# Patient Record
Sex: Male | Born: 1960 | Race: White | Hispanic: No | Marital: Married | Smoking: Never smoker
Health system: Western US, Academic
[De-identification: ages and names within clinical notes are randomized; demographics above are authoritative.]

---

## 2015-12-22 IMAGING — DX CHEST PA AND LATERAL
3 series · 3 of 3 positions shown · non-contrast
Comparison: none

CLINICAL INDICATION:  Cough. Chest pain for one week. Evaluate for pneumonia.
No
other chest complaints.
COMPARISON EXAMINATIONS: None prior.

[lateral]
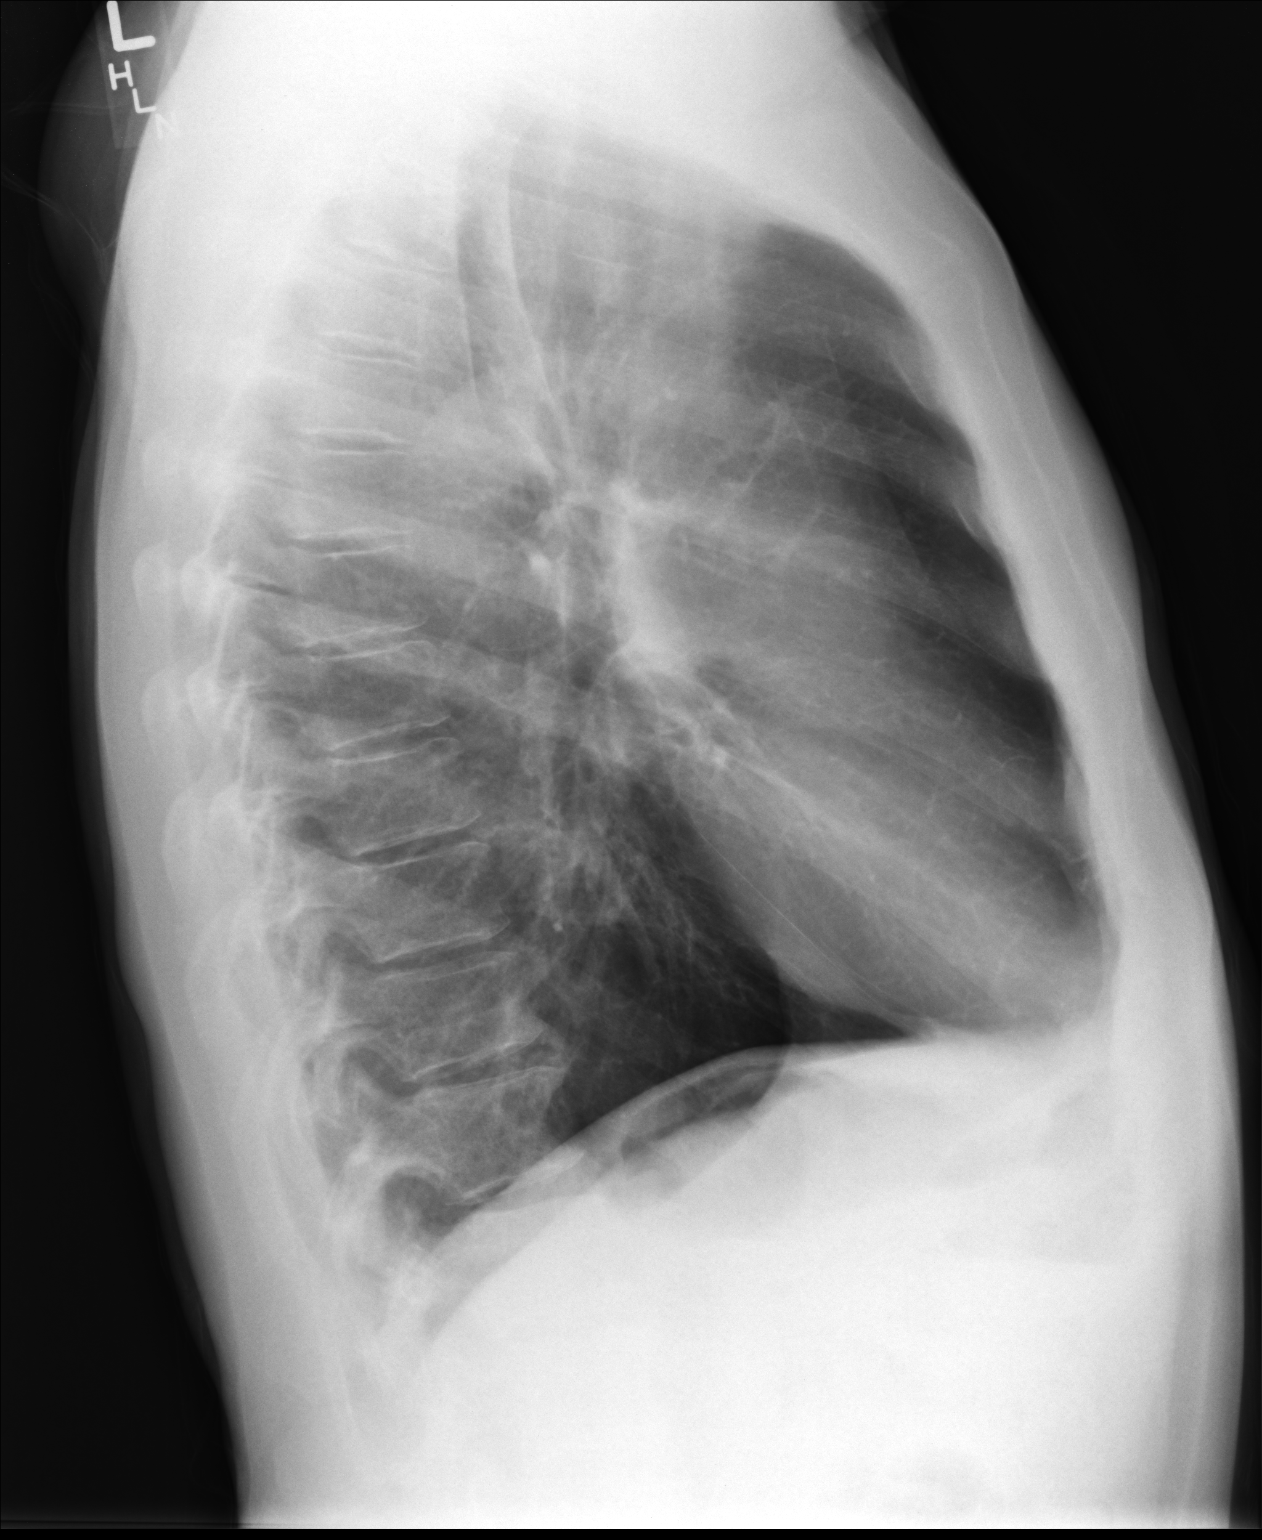

[PA (1 of 2)]
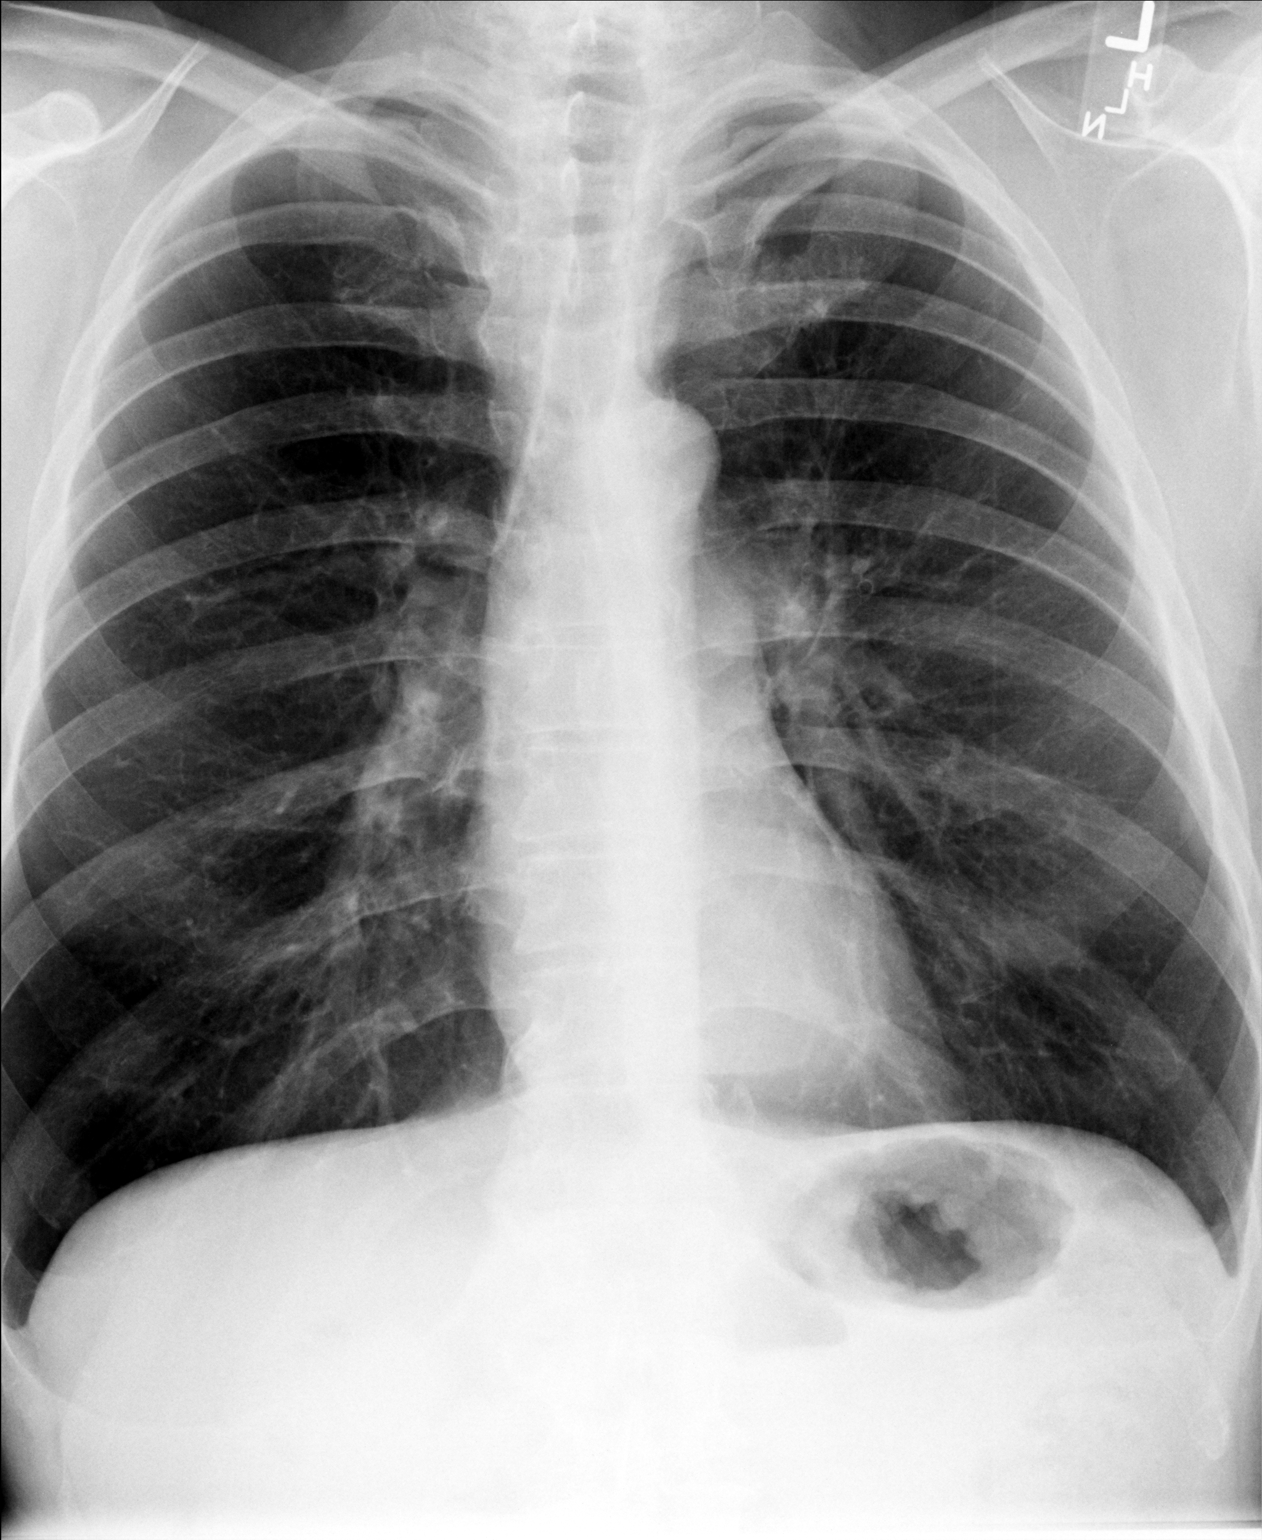

[PA (2 of 2)]
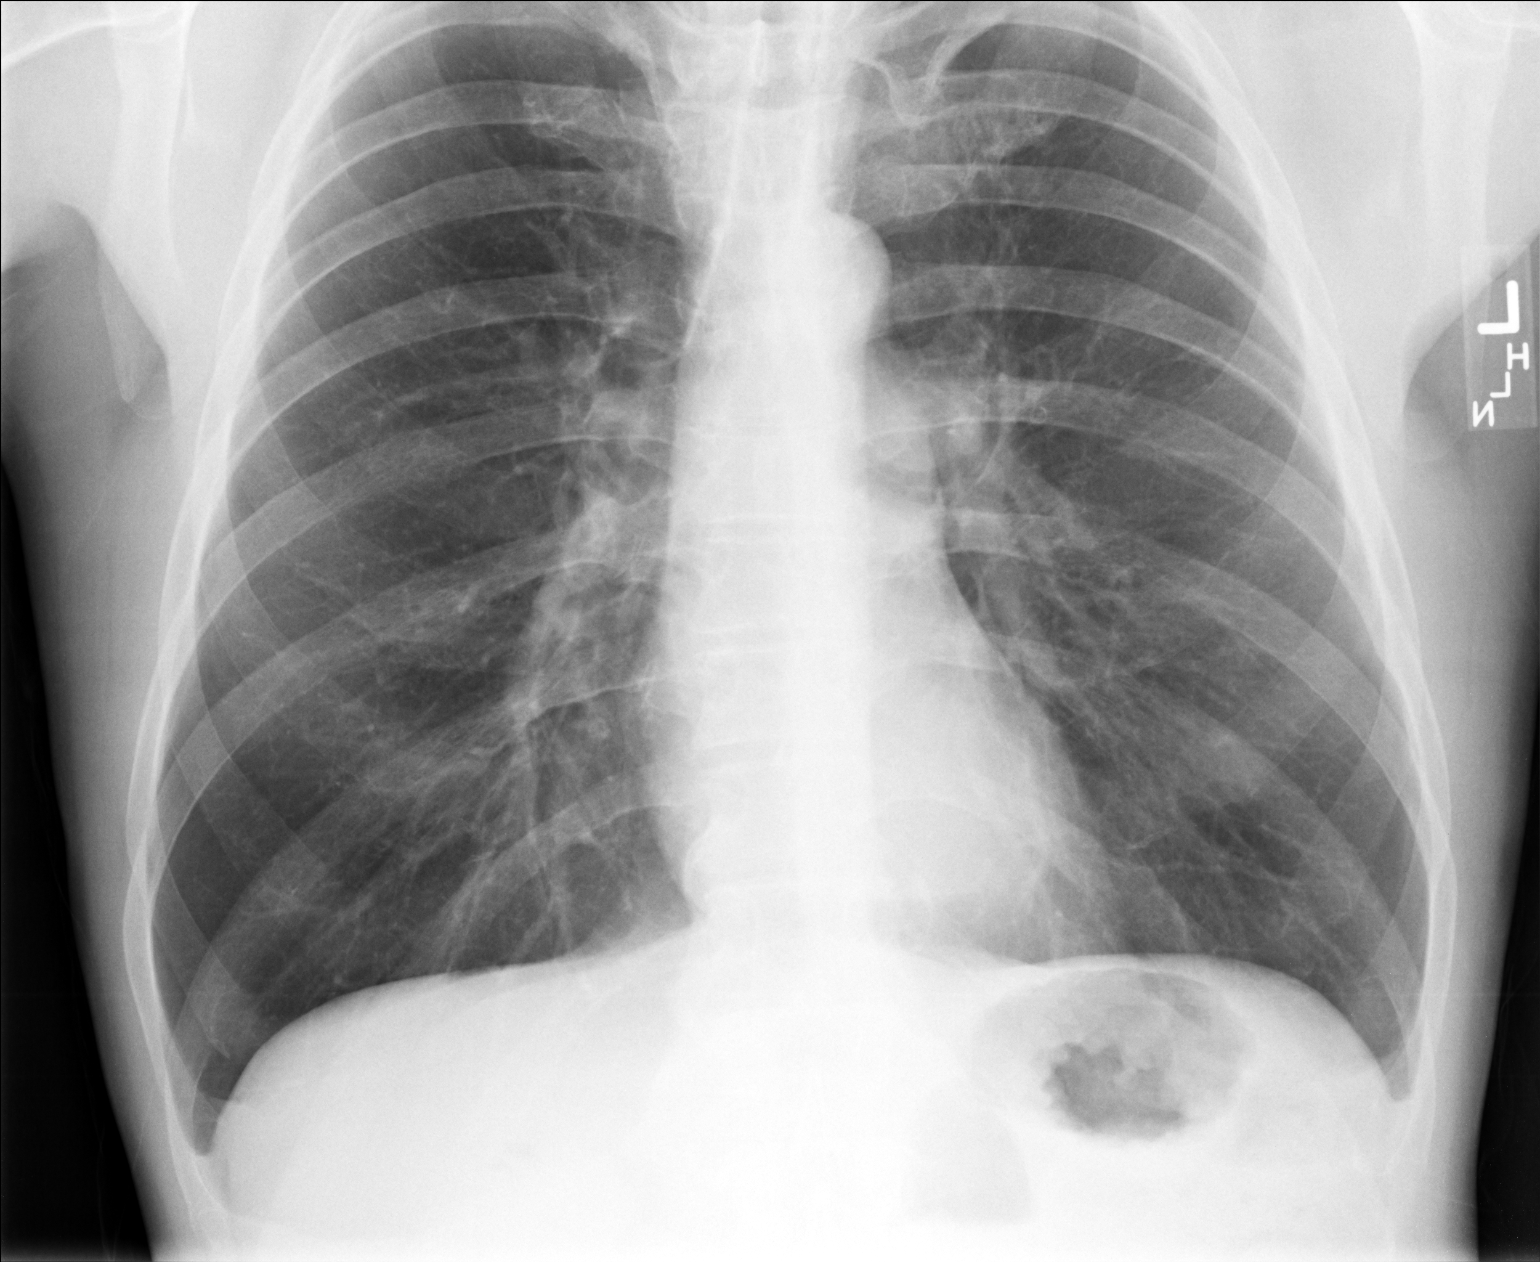

[3 of 3 positions shown; findings below may reference images not displayed]

FINDINGS: The lungs are clear. No focal consolidation. No effusion. No
pneumothorax. Normal cardiomediastinal silhouette. No acute osseous
abnormality.
IMPRESSION: No acute cardiopulmonary findings.

## 2020-01-29 IMAGING — MR MRI CERVICAL SPINE WITHOUT CONTRAST
7 series · 40 of 48 positions shown · IV contrast (gadolinium)
Comparison: None

Arizola, Eddyson 
MRI CERVICAL SPINE WITHOUT CONTRAST, 01/29/2020 [DATE]: 
CLINICAL INDICATION: PICC the pubic cervical pain extending the arms and 
shoulders
TECHNIQUE: Sagittal T1, Sagittal T2, Sagittal STIR, Axial TSE and Axial SA44L 
images of the cervical spine were performed without intravenous gadolinium 
enhancement.

[Series 101: survey · axial · 10.0mm · 0.94mm/px · z∈[-15,+150]mm · 3 of 9 slices shown]
[im 1/9]
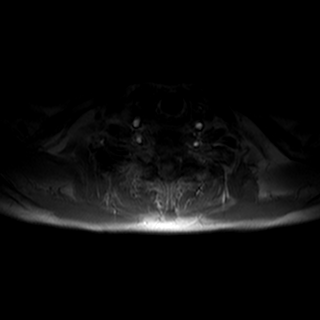
[im 5/9]
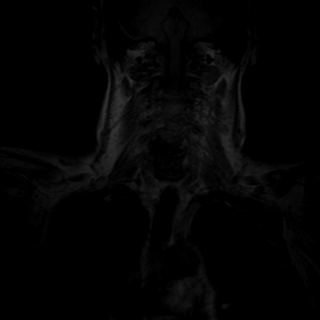
[im 9/9]
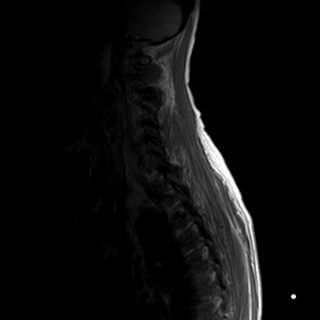

[Series 301: t1w_tse sag · sagittal · 3.0mm · 0.45mm/px · 5 of 17 slices shown]
[im 1/17]
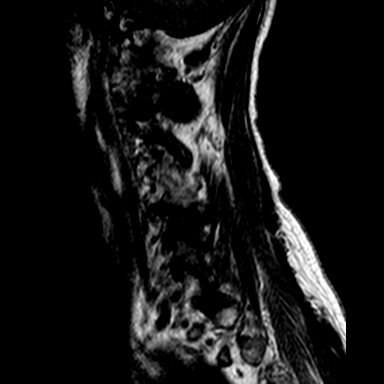
[im 5/17]
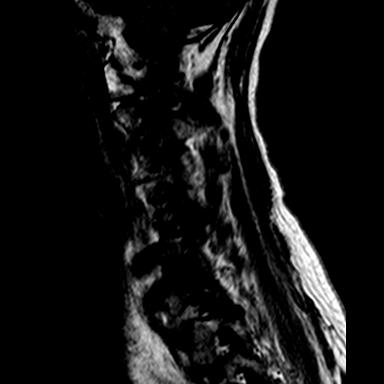
[im 9/17]
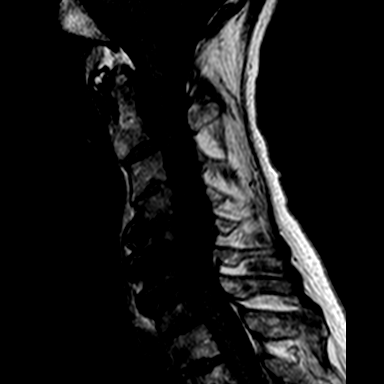
[im 13/17]
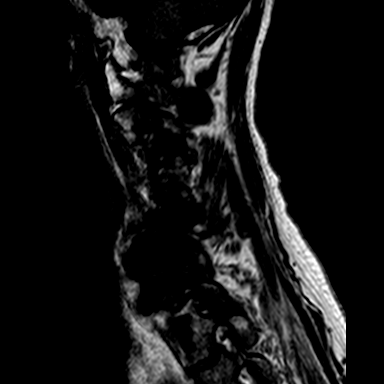
[im 17/17]
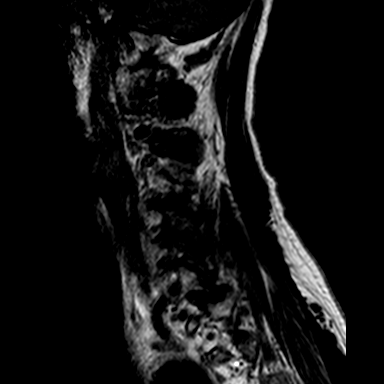

[Series 401: t2w_tse sag · sagittal · 3.0mm · 0.46mm/px · 5 of 17 slices shown]
[im 1/17]
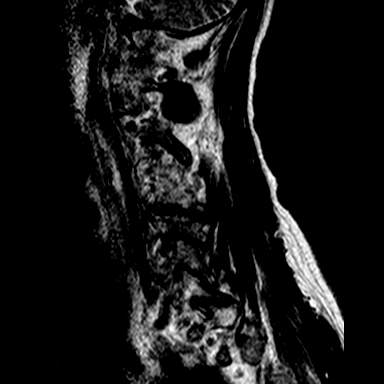
[im 5/17]
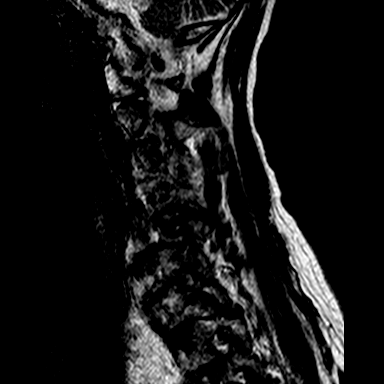
[im 9/17]
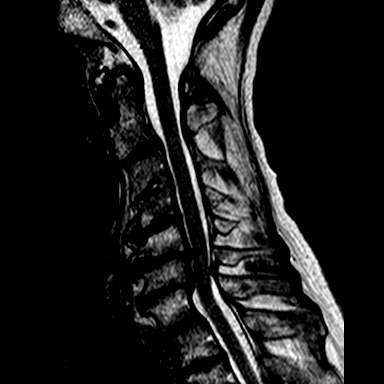
[im 13/17]
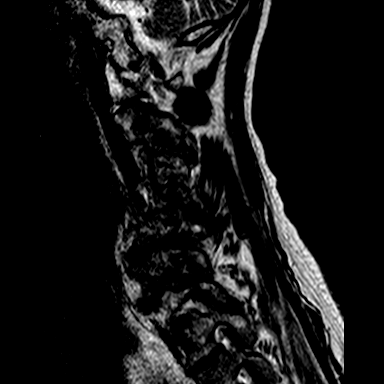
[im 17/17]
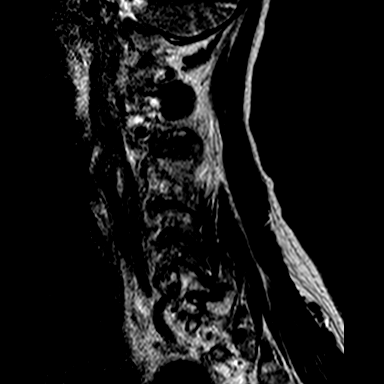

[Series 601: t2w_tse ax · axial · 3.0mm · 0.44mm/px · z∈[-45,+56]mm · 8 of 36 slices shown]
[im 1/36]
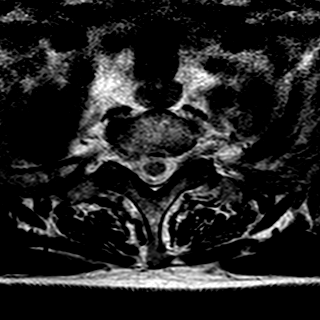
[im 4/36]
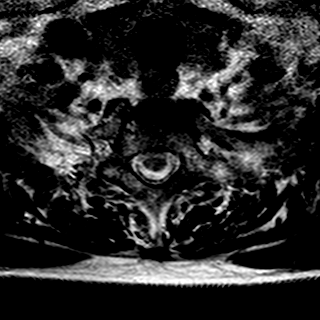
[im 12/36]
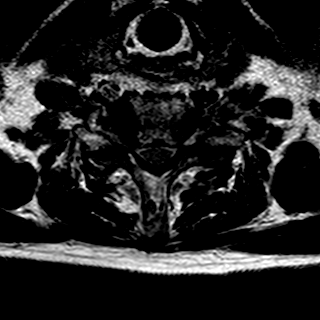
[im 16/36]
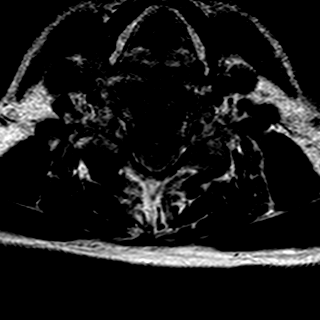
[im 20/36]
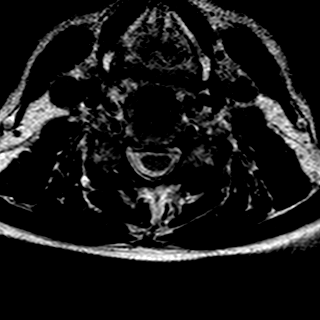
[im 24/36]
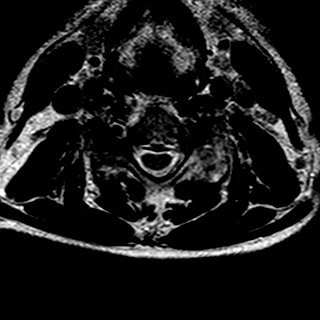
[im 32/36]
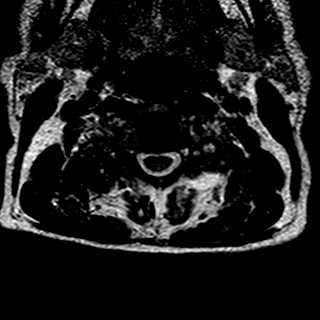
[im 36/36]
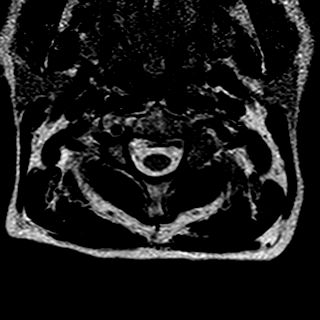

[Series 701: t2w_ffe_(id) · axial · 3.0mm · 0.44mm/px · z∈[+3,+53]mm · 8 of 36 slices shown (1 of 2)]
[im 1/36]
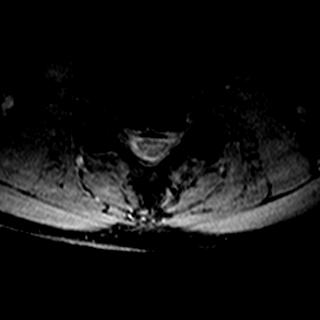
[im 4/36]
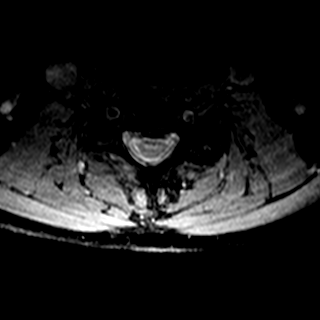
[im 12/36]
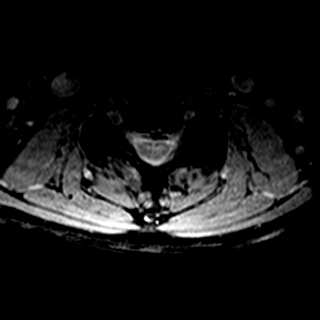
[im 16/36]
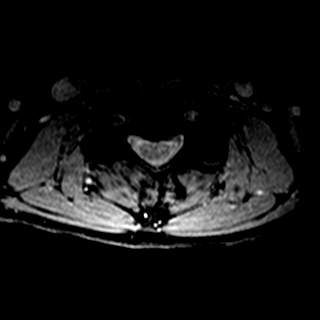
[im 20/36]
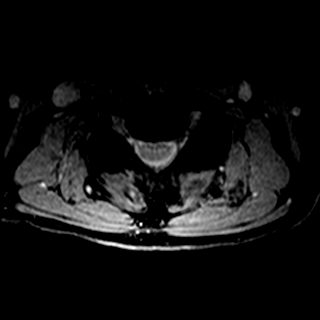
[im 24/36]
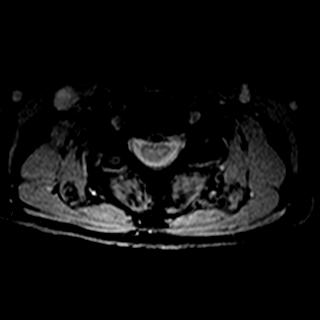
[im 32/36]
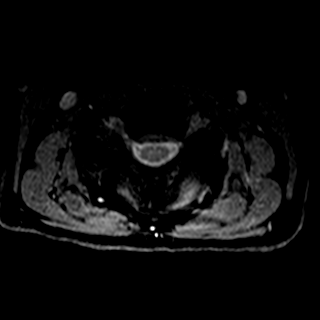
[im 36/36]
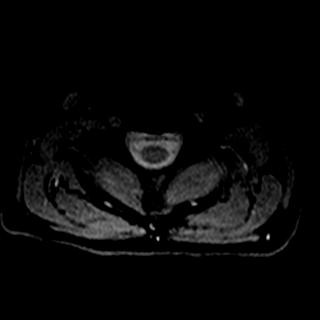

[Series 801: t2w_ffe_(id) · axial · 3.0mm · 0.44mm/px · z∈[-49,+1]mm · 8 of 36 slices shown (2 of 2)]
[im 1/36]
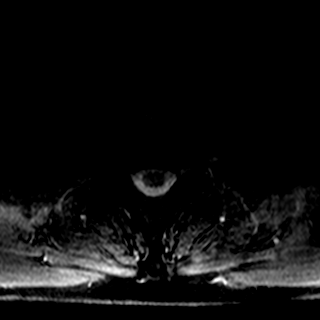
[im 4/36]
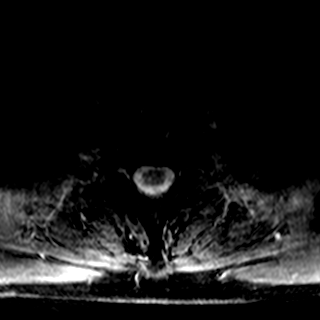
[im 12/36]
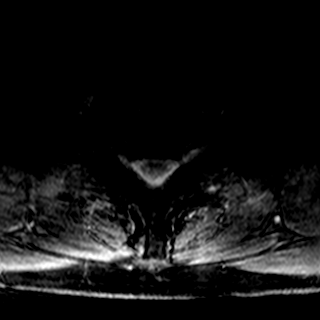
[im 16/36]
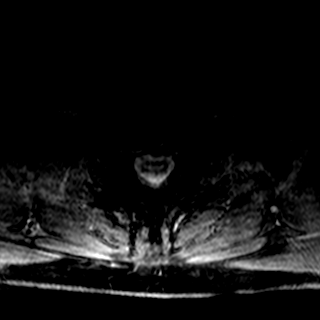
[im 20/36]
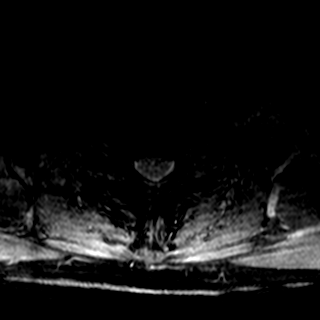
[im 24/36]
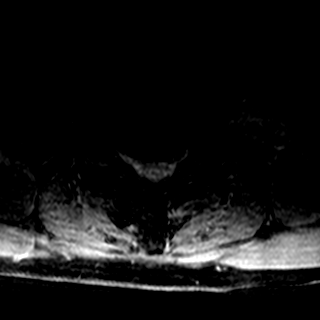
[im 32/36]
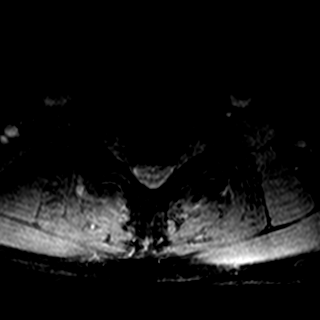
[im 36/36]
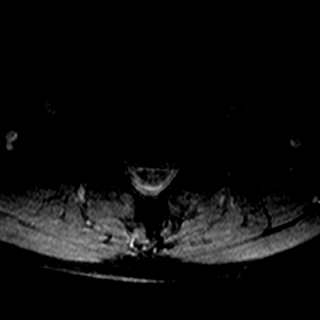

[Series 901: stir_longte sag · sagittal · 3.0mm · 0.55mm/px · 3 of 17 slices shown]
[im 1/17]
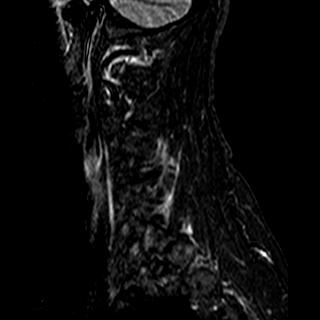
[im 5/17]
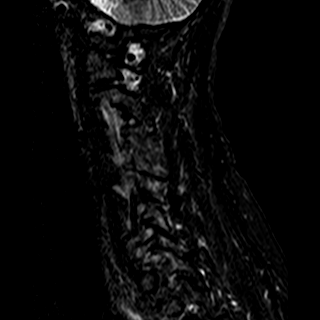
[im 9/17]
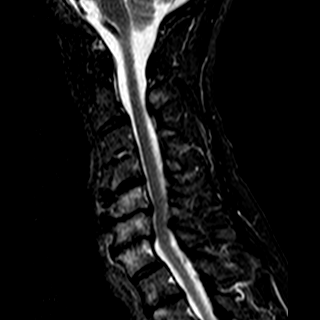

[40 of 48 positions shown; findings below may reference images not displayed]

FINDINGS: Cervical vertebral heights are there is spondylosis, with marked disc 
narrowing at C3-4, C5-6 and C6-7, also at T1-2. There is less than grade 1 
anterolisthesis at C7-T1 measuring 2 mm. Lordosis is straightened. There is 
slight anterolisthesis at C2-3, approximately 2 mm. 
There are atlantoaxial degenerative changes. 
Sagittal STIR images show reactive endplate edema, most pronounced at C5-6 and 
C6-7. Cord signal is normal. The craniocervical junction is open. The dens and 
atlanto-dental interval are intact. Lower posterior fossa contents are 
unremarkable. There is no evidence for recent fracture or spinal malignancy. 
There are zygapophyseal facet degenerative changes. 
Axial images from C2-C3 through C7-T1 show mild disc bulge at C2-3 slightly 
separated from the cord, with mild canal stenosis. There is moderate to marked 
left, moderate right foraminal stenosis. 
At C3-4 the canal is borderline narrow. There appears to be moderate to marked 
bilateral foraminal stenosis. 
At C4-5 the canal is borderline narrow. There is marked bilateral foraminal 
stenosis, axial image 22. 
At C5-6 there is broad-based disc bulge mildly deforming the ventral cord, with 
canal diameter 7.5 mm. There is marked right, moderate left foraminal stenosis, 
axial image 17. 
At C6-7 disc-osteophyte slightly effaces the ventral cord, with canal diameter 8 
mm. There is marked right, moderate to marked left foraminal stenosis. 
At C7-T1 the canal and foramina are open. 
At T1-2 the canal and foramina are open.
IMPRESSION: Spondylosis. Canal stenosis is most pronounced at C5-6 where there is mild cord 
deformity. At C6-7 there is slight cord deformity . There is foraminal stenosis 
throughout the cervical spine as described. 
Straightened lordosis could indicate spasm. 
There are degenerative reactive endplate changes, most pronounced at C5-6 and 
C6-7. There is no evidence for fracture or malignancy. No cord signal 
abnormality identified. 
There are advanced zygapophyseal facet changes throughout the cervical spine.

## 2020-10-01 IMAGING — DX KNEE 2 VIEWS RIGHT
1 series · 2 of 2 positions shown · non-contrast
Comparison: None.

KNEE 2 VIEWS RIGHT, KNEE 2 VIEWS LEFT, 10/01/2020 [DATE]: 
CLINICAL INDICATION:  Left knee pain. Right knee for comparison.

[Series 1: AP · 0.14mm/px · 2 of 2 slices shown]
[im 1/2]
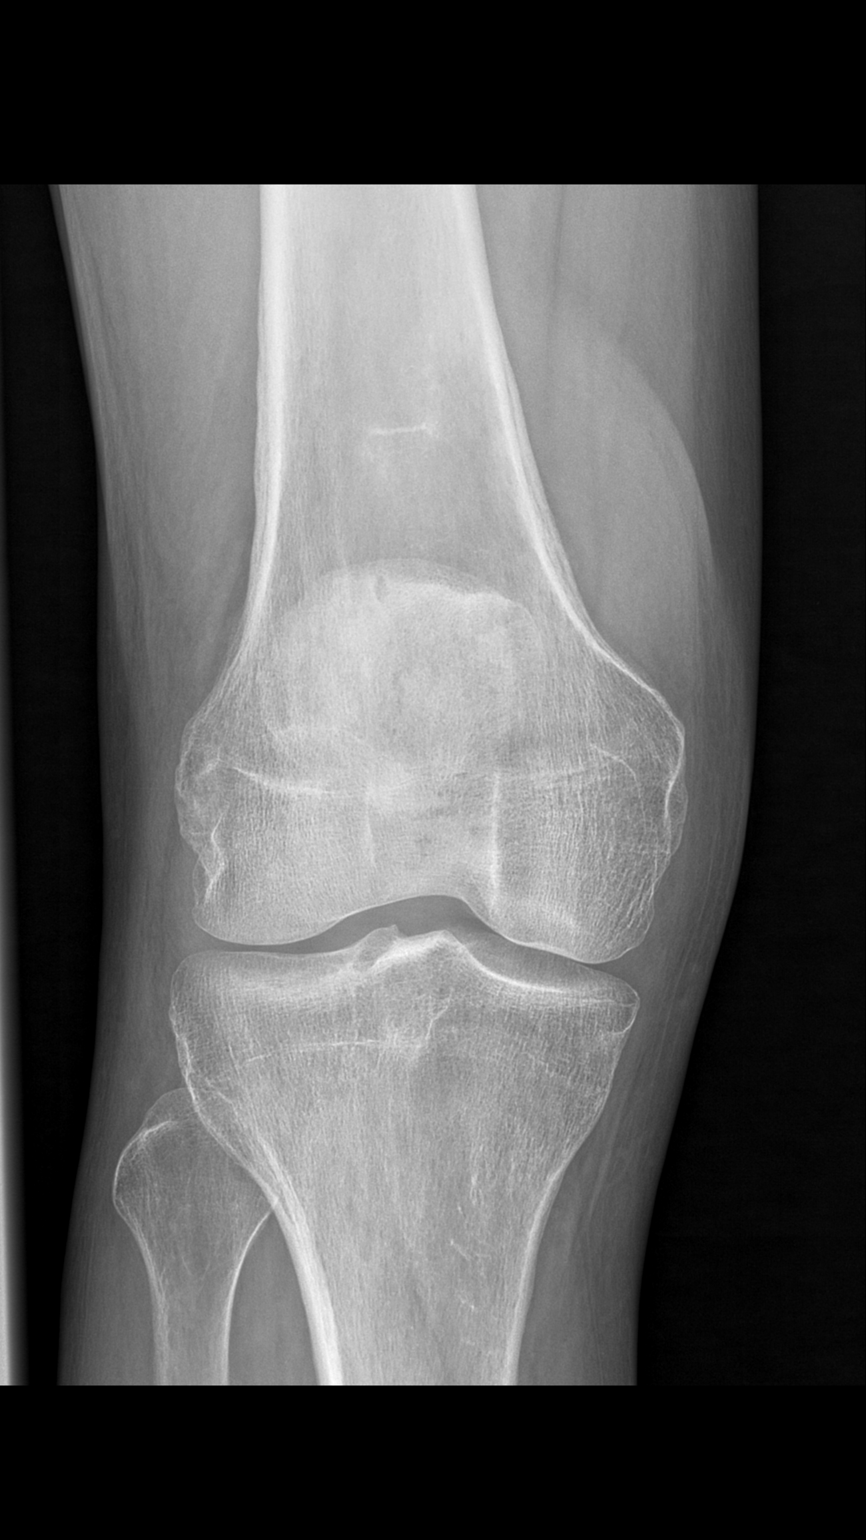
[im 2/2]
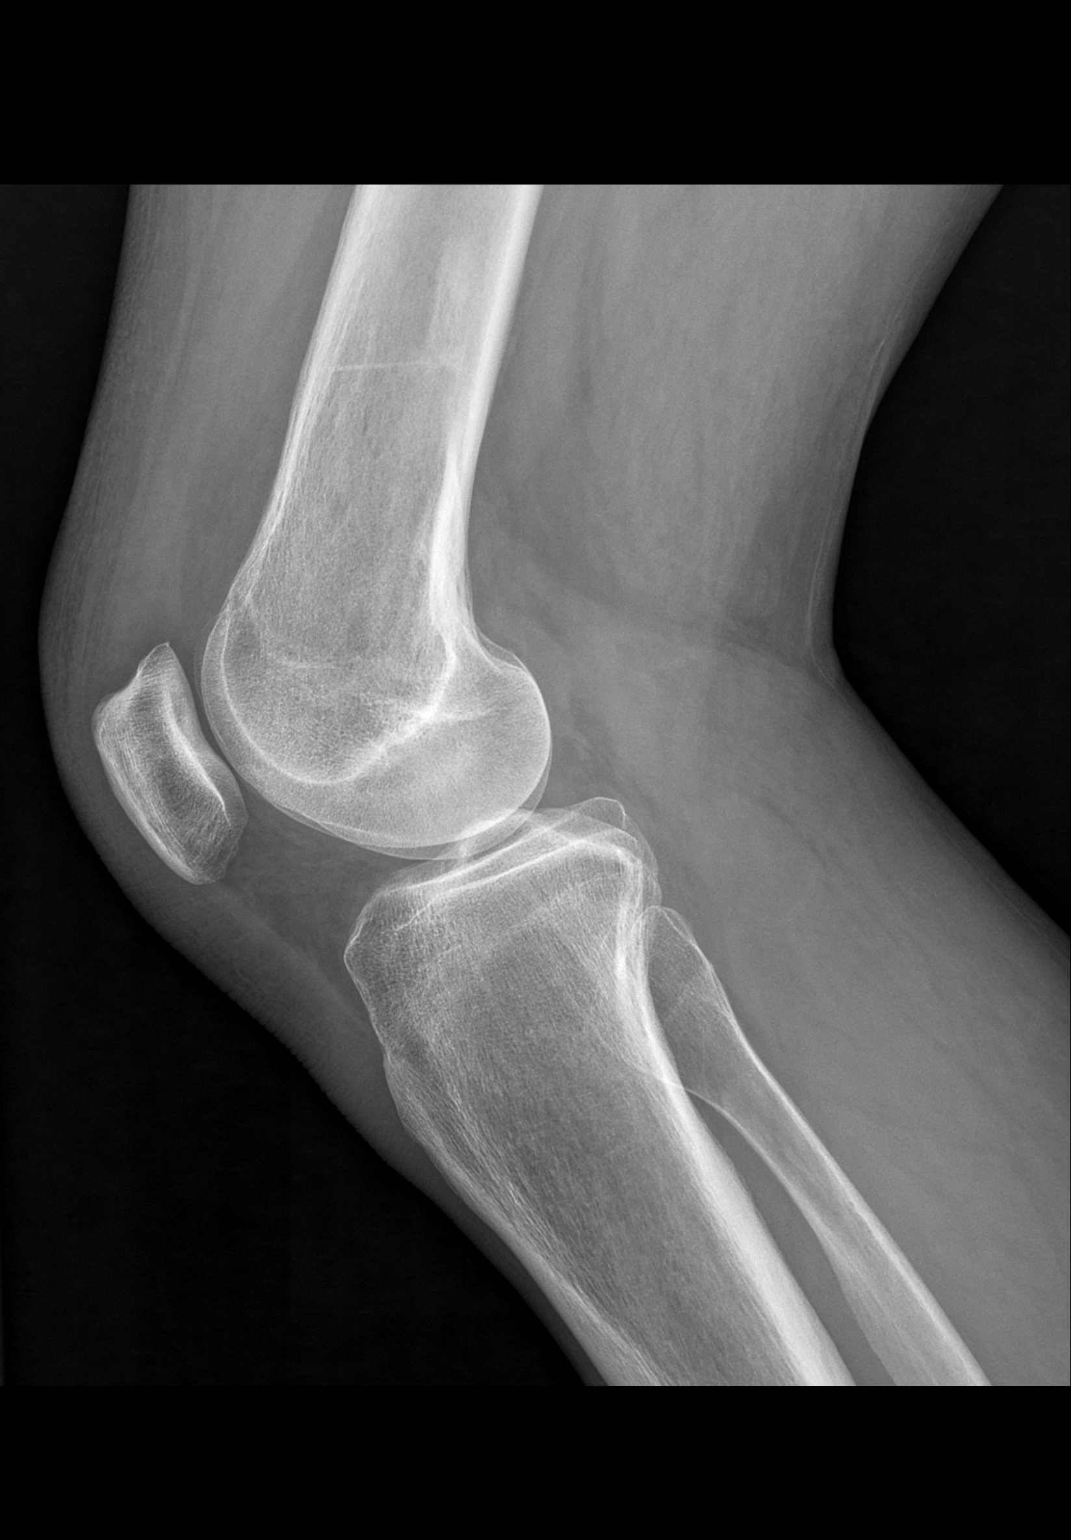

[2 of 2 positions shown; findings below may reference images not displayed]

FINDINGS: No fracture. Moderate left medial and mild patellofemoral joint 
degenerative change and small joint effusion. Right knee joint spaces are 
preserved. Normal bone mineralization. Mild atherosclerosis.
IMPRESSION: 1. Moderate degenerative change of the left knee and small joint effusion. 
2. Normal right knee.

## 2020-10-01 IMAGING — DX KNEE 2 VIEWS LEFT
1 series · 2 of 2 positions shown · non-contrast
Comparison: None.

KNEE 2 VIEWS RIGHT, KNEE 2 VIEWS LEFT, 10/01/2020 [DATE]: 
CLINICAL INDICATION:  Left knee pain. Right knee for comparison.

[Series 1: AP · 0.14mm/px · 2 of 2 slices shown]
[im 1/2]
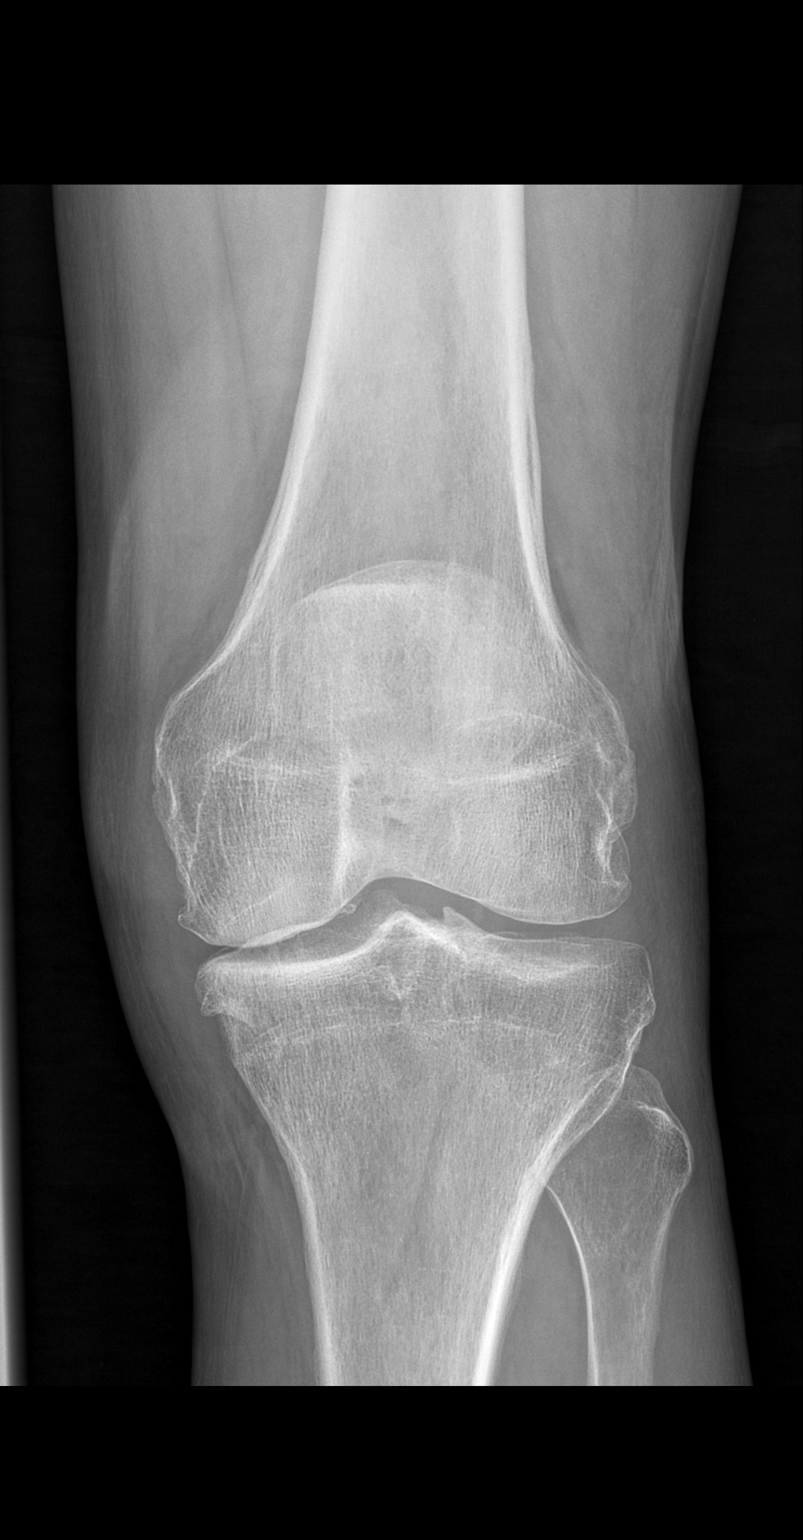
[im 2/2]
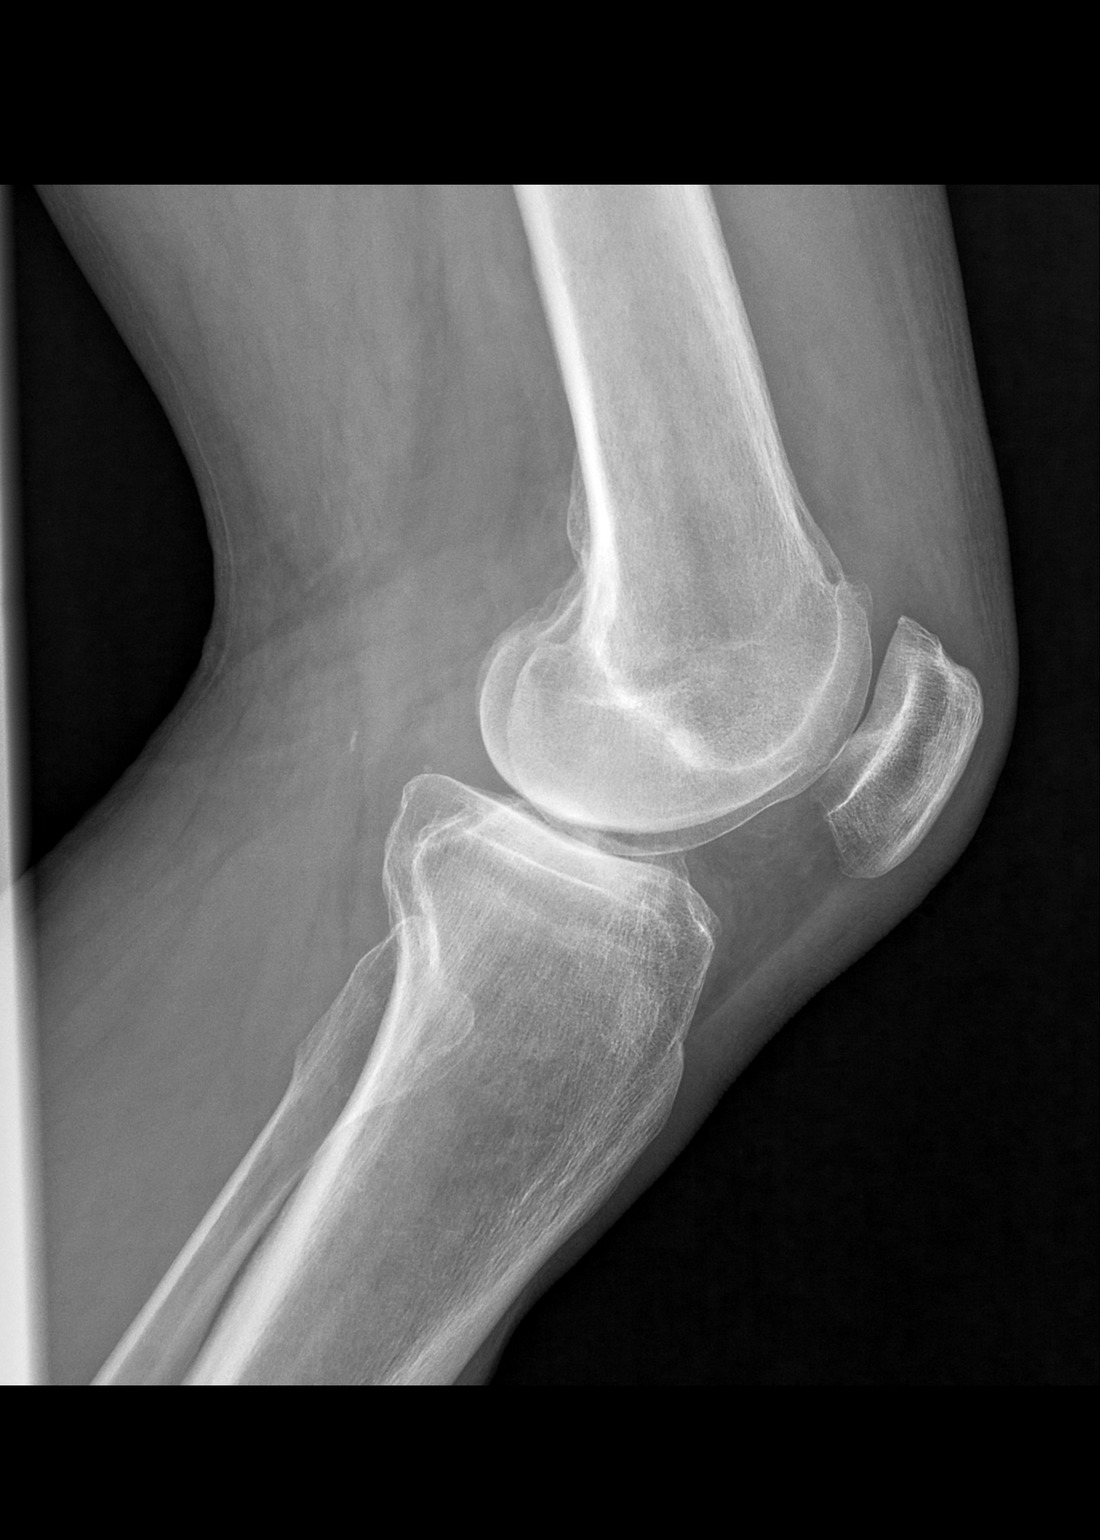

[2 of 2 positions shown; findings below may reference images not displayed]

FINDINGS: No fracture. Moderate left medial and mild patellofemoral joint 
degenerative change and small joint effusion. Right knee joint spaces are 
preserved. Normal bone mineralization. Mild atherosclerosis.
IMPRESSION: 1. Moderate degenerative change of the left knee and small joint effusion. 
2. Normal right knee.

## 2021-01-19 ENCOUNTER — Encounter: Payer: Self-pay | Admitting: Orthopaedic Surgery

## 2021-01-19 DIAGNOSIS — M25561 Pain in right knee: Secondary | ICD-10-CM

## 2021-01-20 ENCOUNTER — Inpatient Hospital Stay (INDEPENDENT_AMBULATORY_CARE_PROVIDER_SITE_OTHER)
Admit: 2021-01-20 | Discharge: 2021-01-20 | Disposition: A | Discharge status: Home Health | Payer: 59 | Attending: Orthopaedic Surgery | Admitting: Orthopaedic Surgery

## 2021-01-20 ENCOUNTER — Ambulatory Visit (INDEPENDENT_AMBULATORY_CARE_PROVIDER_SITE_OTHER): Payer: 59 | Admitting: Orthopaedic Surgery

## 2021-01-20 ENCOUNTER — Encounter: Payer: Self-pay | Admitting: Orthopaedic Surgery

## 2021-01-20 VITALS — BP 107/84 | HR 92 | Ht 71.0 in | Wt 175.0 lb

## 2021-01-20 DIAGNOSIS — M170 Bilateral primary osteoarthritis of knee: Secondary | ICD-10-CM

## 2021-01-20 DIAGNOSIS — M25561 Pain in right knee: Secondary | ICD-10-CM

## 2021-01-20 DIAGNOSIS — M25461 Effusion, right knee: Secondary | ICD-10-CM

## 2021-01-20 NOTE — Patient Instructions (Addendum)
Osteoarthritis     You were diagnosed with osteoarthritis.    Osteoarthritis (OA) is a type of problem with the joints where they start to suffer from wear and tear, as you get older. It is also called "degenerative arthritis" or "degenerative joint disease."    Normally, there is a tough, rubbery layer of cartilage on the ends of each of the bones that makes up a joint. The cartilage is there to protect the bone and to provide a cushioned, slippery surface. This way, the bones can move smoothly when the joint is bending. When the cartilage gets worn, the bone ends are not as well protected. As the exposed bones rub against each other when the joint bends, it causes irritation, pain and swelling. Any joint can be affected. However, OA is most common in the spine, hands, hips, knees, and feet.     OA is more common as a person gets older. Aging itself does not cause OA. However, changes in the body that happen when one gets older make the chance of getting OA more likely.     Other things that can speed up the development of OA are:     Repetitive activity that involves constantly stressing the joints.   Being overweight. This puts more wear and tear on the joints.   Family history of OA. OA tends to run in families.   Fractures that extend into the joint and cause injury to the cartilage surfaces.     Symptoms of OA can include:   Joint pain. This is usually made worse with heavy activity and repetitive bending.   Stiffness in the joint when bending and moving the joint. This is usually worse in the morning. It gets better with activity as the joint "warms up."   Swelling. Joint swelling can happen when fluid seeps into the joint from chronic irritation.   Crackling and popping noises when the joint is moved.   Lumps of enlarged bone that can develop on the fingers.    There is no cure for OA. The treatment of OA is designed to control pain and slow down the wear and tear process. Some things that  are used to treat OA include:     Pain medications: Acetaminophen (Tylenol) and medications called NSAIDS like ibuprofen (Advil/Motrin) are often used first for pain control.     Steroid injections: This can cause temporary relief of pain. However, repeated injections into the joints can lead to more joint problems.     Life style changes: This includes weight loss and getting involved in exercise programs to help strengthen muscles around painful joints without making the wear and tear worse.     Physical therapy (PT): This may help to build strength and decrease joint stiffness.     In more severe cases, joint replacement surgery may be an option if symptoms can't be controlled using conventional measures.    There does NOT seem to be any evidence that "alternative treatments" like vitamins (A, E, and C), glucosamine, ginger, chondroitin sulfate and others have any helpful effects. ALWAYS talk to your doctor if you are going to try any alternative treatments.     Take all prescribed medications as directed. Unless you are told something different by your doctors, you can take over-the-counter pain medicines. These medicines can include ibuprofen (Advil/Motrin) or acetaminophen (Tylenol). Follow the directions on the package.    Get involved in any physical therapy recommended by your doctor.    YOU SHOULD   SEEK MEDICAL ATTENTION IMMEDIATELY, EITHER HERE OR AT THE NEAREST EMERGENCY DEPARTMENT, IF ANY OF THE FOLLOWING OCCUR:   You have any of the above symptoms and are unable to see or contact your doctor.   You get a fever (temperature higher than 100.43F or 38C) that is associated with pain or redness over your affected joint.   You have severe swelling in your painful joint.    If you can't follow up with your doctor, or if at any time you feel you need to be rechecked or seen again, come back here or go to the nearest emergency department.      For more information including non-operative  treatment, please go to:     [email or web address]

## 2021-01-20 NOTE — Progress Notes (Signed)
Cory Nguyen. Yoskar Murrillo, MD   Chief, Division of Hip & Knee Arthroplasty   Assistant Clinical Professor     Department of Orthopaedic Surgery     [facility]   [address] [place], [place] [address]  [phone]     [facility]   [address]   [place], [place] [address]  [phone]                         Office Consultation    Date of Visit: 01/20/2021    Chief Complaint: right knee pain.    History of Present Illness:  Cory Nguyen is a pleasant 60 year old White Non-Hispanic male, who presents to clinic for evaluation.    He has been kindly referred by Dr. Seymour Bars. Symptoms have been present for 8 years. They started gradually. There was no injury. Quality described as pain. He reports occasional painful knee popping. Symptoms occur daily. Patient reports prior lower extremity injury/pain: s/p bilateral knee arthroscopic meniscectomy. Symptoms are getting worse. Pain is 4-6/10 on average. He has had x-rays. He has tried physical therapy with significant relief. He will take approximately 2 ibuprofen per month. He has had a series of three Synvisc injections to each knee with improvement in his left knee pain, no improvement in his right knee pain. He feels his gait is abnormal due to his knee pain and is worried this will eventually cause more problems.     The patient denies any nausea, vomiting, fever, chills, chest pain, shortness of breath, or any other complications.    PAST MEDICAL HISTORY:   depression    PAST SURGICAL HISTORY:  Hernia repair - 1997  Bilateral knee meniscectomy (right in 2014)    FAMILY HISTORY:  Patient denies pertinent family history.    SOCIAL HISTORY:  Social History     Socioeconomic History   . Marital status: Married     Spouse name: Not on file   . Number of children: Not on file   . Years of education: Not on file   . Highest education level: Not on file   Occupational History   . Not on file   Tobacco Use   . Smoking status: Never Smoker   . Smokeless  tobacco: Never Used   Substance and Sexual Activity   . Alcohol use: Not on file   . Drug use: Not on file   . Sexual activity: Not on file   Other Topics Concern   . Not on file   Social History Narrative   . Not on file     Social Determinants of Health     Financial Resource Strain: Not on file   Food Insecurity: Not on file   Transportation Needs: Not on file   Physical Activity: Not on file   Stress: Not on file   Social Connections: Not on file   Intimate Partner Violence: Not on file   Housing Stability: Not on file       MEDICATIONS:   No outpatient medications have been marked as taking for the 01/20/21 encounter (Office Visit) with Roosevelt Eimers, Cory Bales, MD.         ALLERGIES:   No Known Allergies    REVIEW OF SYSTEMS:    Positive per HPI as well as glasses/contacts, depression and sleep disturbance, otherwise negative for 12-point review or systems.    Physical Examination  General: The patient  is awake, alert, and oriented to person, place, time and situation. The patient is in no acute distress.  Psych: The patient is in good mental health, well groomed and well nourished for age.    Vital Signs:  BP 107/84 (BP Location: Left arm, BP Patient Position: Sitting, BP cuff size: Regular)   Pulse 92   Ht 5\' 11"  (1.803 m)   Wt 79.4 kg (175 lb)   BMI 24.41 kg/m   HEENT: EOMI, trachea midline.  Cardiovascular: +S1S2, peripheral edema not present  Pulmonary: normal respiratory rate, non-labored breathing, no dyspnea  Skin: Intact.  No open lesions, lacerations, or wounds  Lymphatic System: There is no lymphedema, pitting edema, or calf tenderness.  Neurological: Sensation is intact to light touch from L2-S1 distribution bilaterally.  Motor function is 5/5 in all muscle groups including the quads, hamstrings, EHL, FHL, FDL, EDL, TA, and gastrocsoleus complex.  Vascular: Less then 2 second capillary refill, +2 pulses with good distal perfusion.  Musculoskeletal:  The patient is using nothing to assist with ambulation. He  does have an antalgic limp.     Examination of right knee demonstrates 2-150 degrees ROM without pain throughout. Stable to varus and valgus stress throughout range of motion. Mild varus deformity fully correctable. positive patellar grind test. positive medial joint line tenderness to palpation. Negative lateral joint line tenderness to palpation.     Examination of left knee demonstrates 2-160 degrees ROM without pain throughout. Positive medial joint line tenderness to palpation. Negative lateral joint line tenderness to palpation. Minimal patellar grind.     REVIEW OF IMAGING:   I personally reviewed and interpreted the plain films obtained in clinic today which demonstrates moderate to severe right knee osteoarthritis with near complete medial joint space narrowing, subchondral sclerosis, osteophyte formation. Near complete patellofemoral joint space narrowing, subchondral sclerosis, osteophyte formation.      ASSESSMENT AND PLAN:    This is a pleasant, 60 year old White Non-Hispanic male, who is being seen in consultation for bilateral knee osteoarthritis, right > left.    I have personally reviewed and interpreted the patient's prior and current diagnostic testing, relevant labs and imaging, risk factors, and external notes from their other treating physicians in the process of formulating my assessment and plan of care for their current symptoms and pathology.    I discussed with the patient their diagnosis and plan. Patient has failed non-operative treatment. Risks, benefits, and alternatives to surgery were discussed including, but not limited to bleeding, infection, neurovascular damage, perioperative fracture/dislocation, failure to attenuate symptoms, need for further surgery, and medical complications such as DVT, PE, MI, stroke, and death. The patient expressed understanding and will discuss with his wife before making a final decision.    All of the patient's questions were answered to their  satisfaction. They expressed understanding and agreed with the plan. We will see Mr. Hartwig back PRN.    Scribe Attestation  The notes I am recording reflect only actions made by and judgments taken by this provider, Dr. Katrinka Blazing Tiffaney Heimann, for whom I am scribing today.  I have performed no independent clinical work.    Onalee Hua    ____________________________________________________________________    Provider Attestation for Scribed Note    As the attending provider, I agree with the scribed content.  Any changes or edits are noted in the text above.    Andre Lefort Nevin Grizzle        Cory Nguyen. Linford Quintela, MD  Chief, Hip & Knee Arthroplasty  Associate Residency Program Director  Assistant Clinical Professor  Department of [place] [facility] [place]

## 2022-07-14 IMAGING — DX KNEE 3 VIEWS LEFT
1 series · 3 of 3 positions shown · non-contrast
Comparison: None.

________________________________________________________________________________________________ 
KNEE 3 VIEWS LEFT, 07/14/2022 [DATE]: 
CLINICAL INDICATION: Pain.

[Series 1: AP · 0.14mm/px · 3 of 3 slices shown]
[im 1/3]
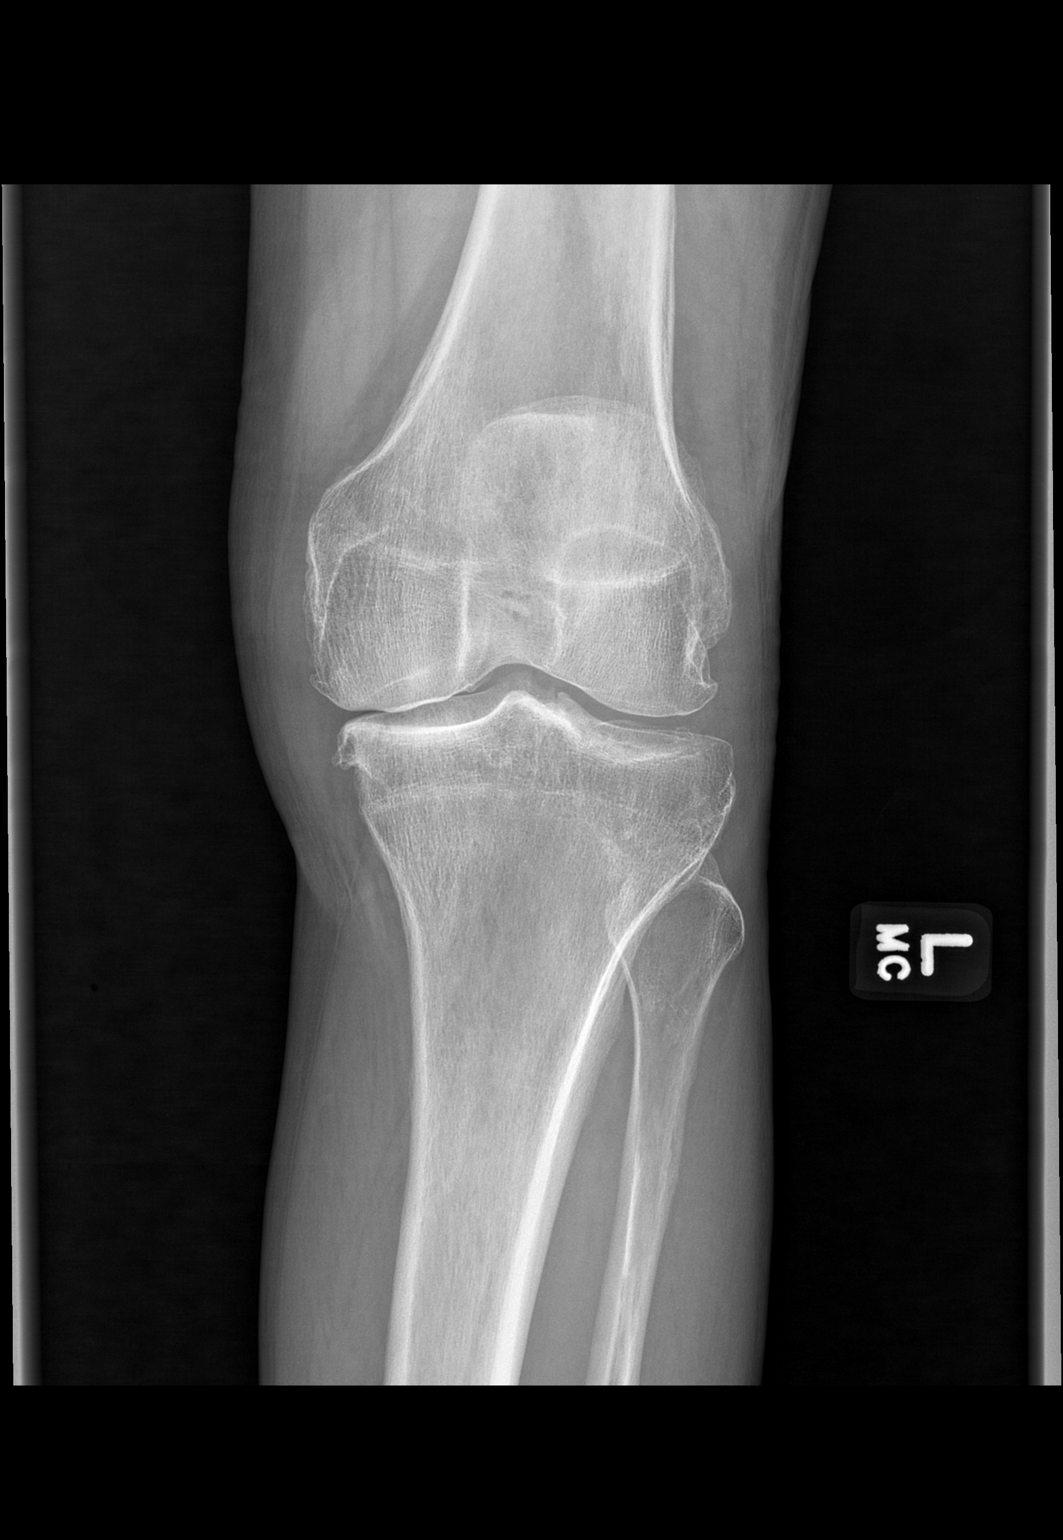
[im 2/3]
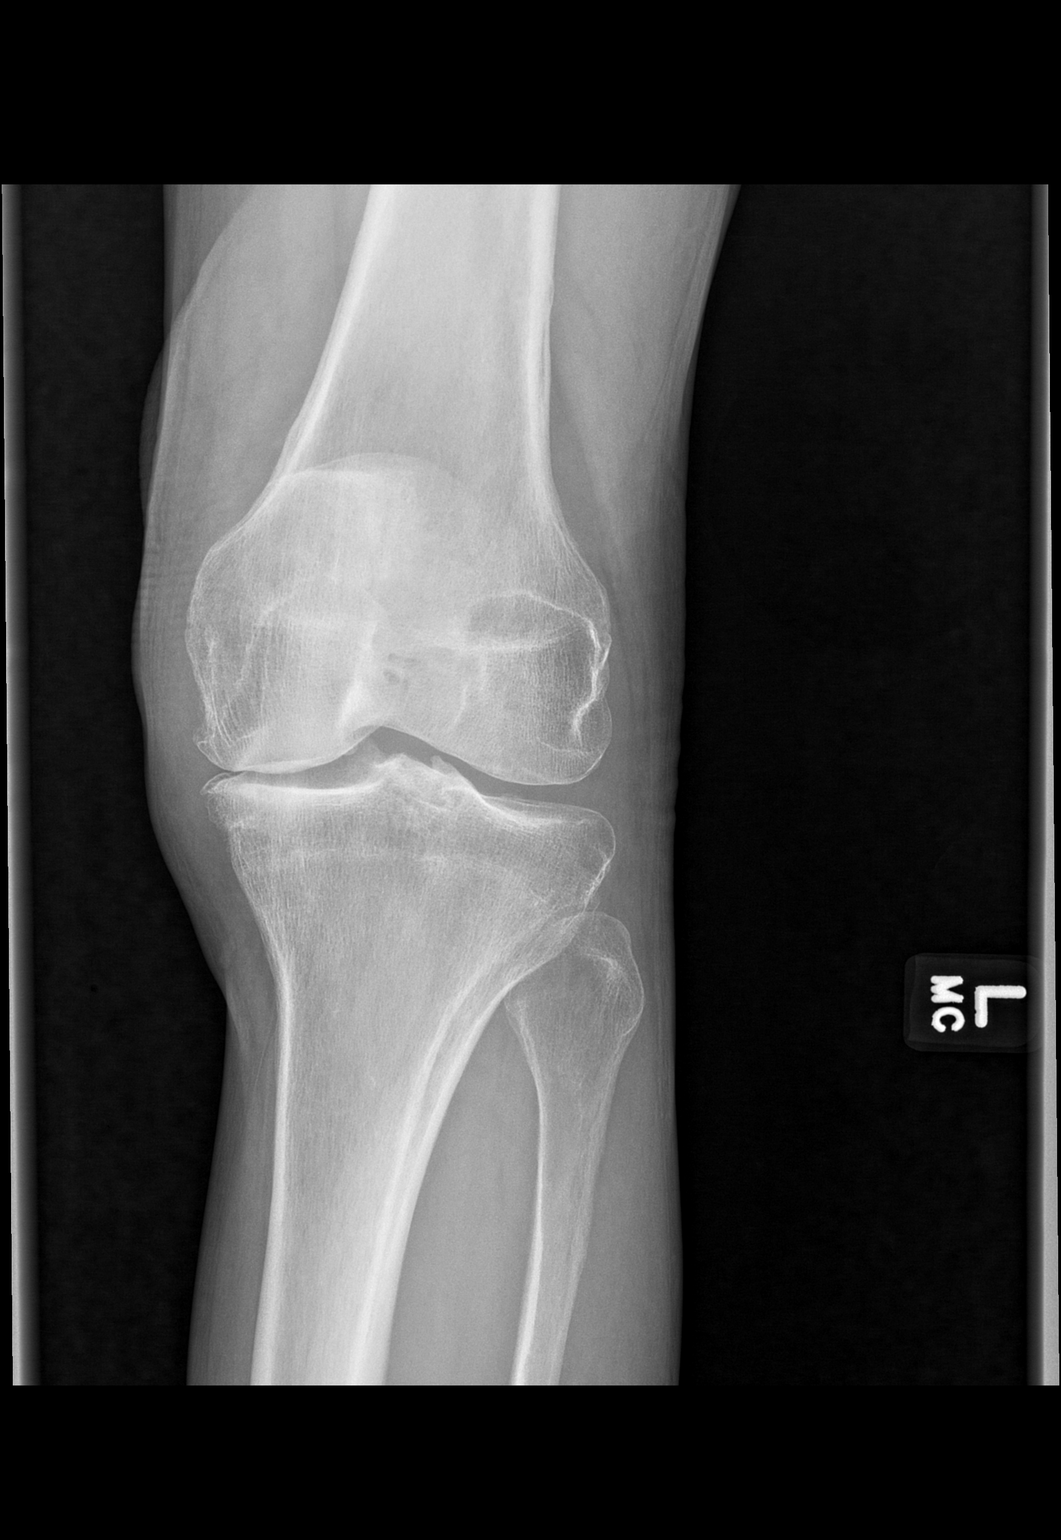
[im 3/3]
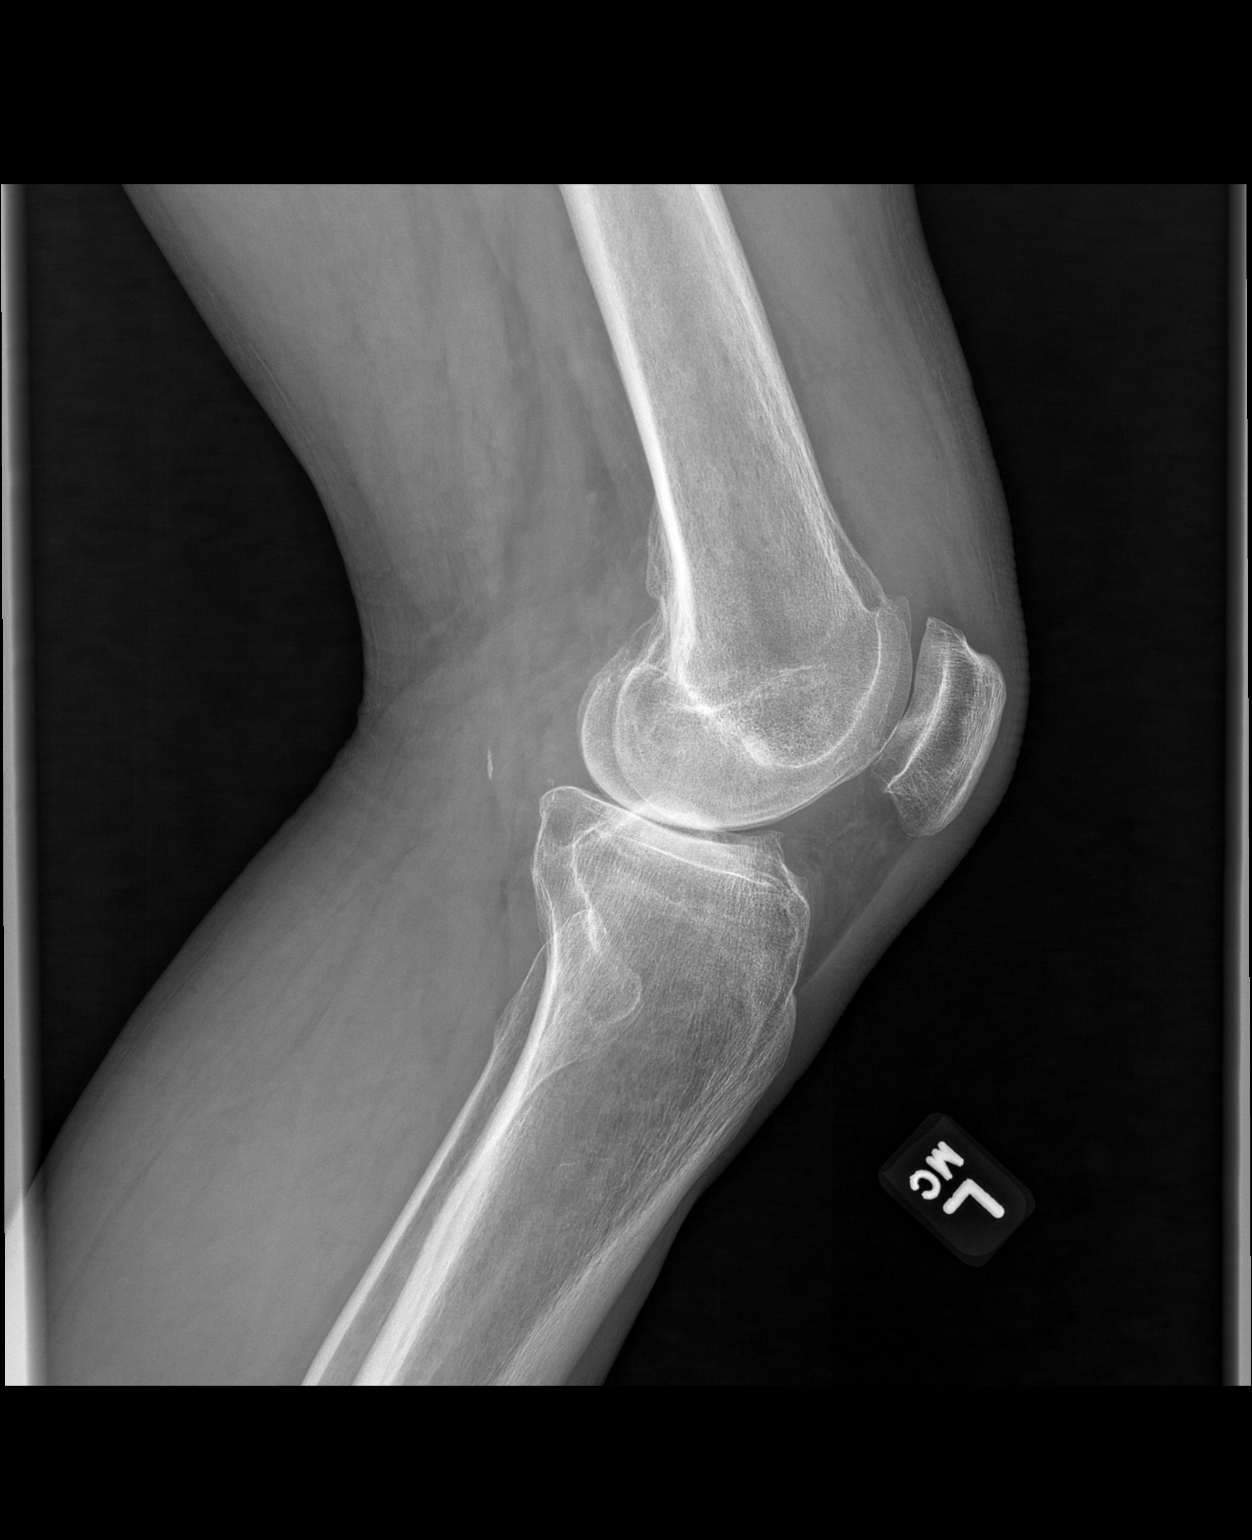

[3 of 3 positions shown; findings below may reference images not displayed]

FINDINGS: Moderate joint space narrowing medial compartment with marginal 
osteophytic spurring. Moderate knee joint effusion. No fracture. Normal 
alignment. No focal soft tissue swelling. Mild vascular calcifications.
IMPRESSION: Moderate knee joint effusion. If symptoms persist, consideration could be made 
for MR exam.

## 2022-07-14 IMAGING — DX CHEST PA AND LATERAL
1 series · 3 of 3 positions shown · non-contrast
Comparison: 7882 exam

________________________________________________________________________________________________ 
CLINICAL INDICATION: Chronic Cough.

[Series 1: PA · 0.14mm/px · 3 of 3 slices shown]
[im 1/3]
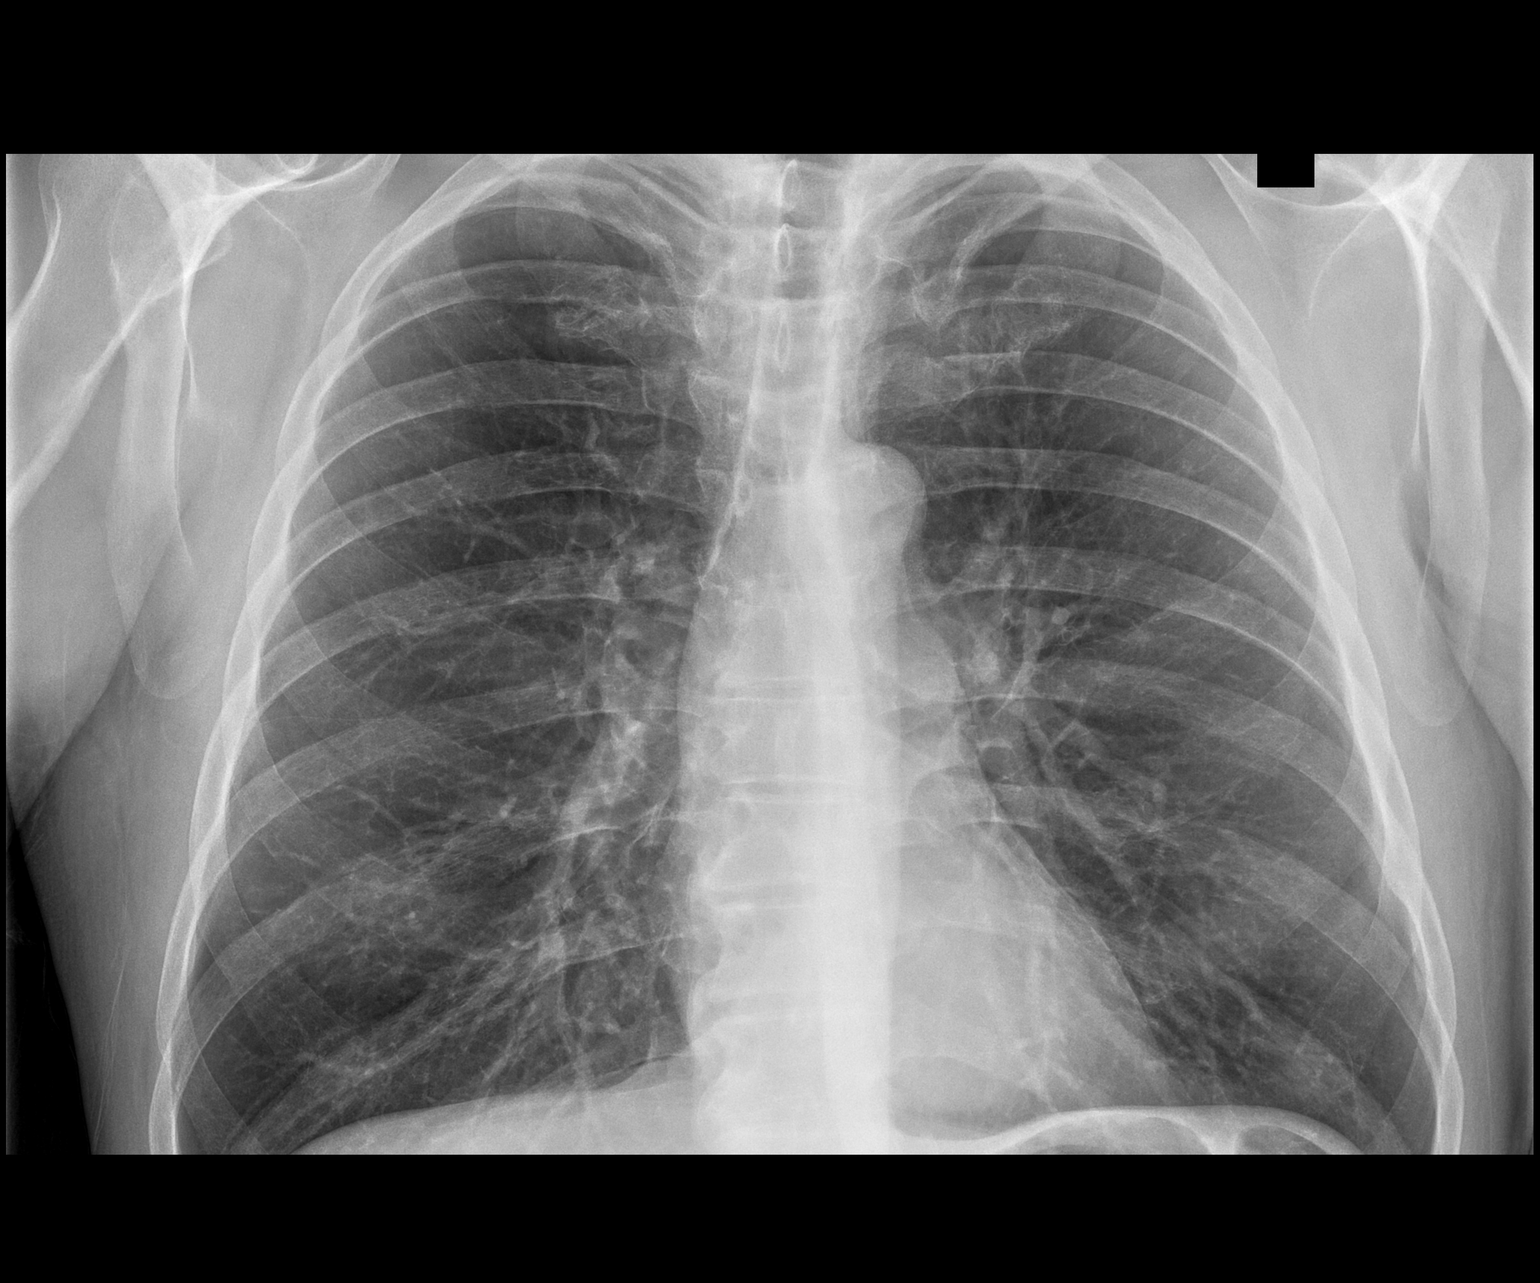
[im 2/3]
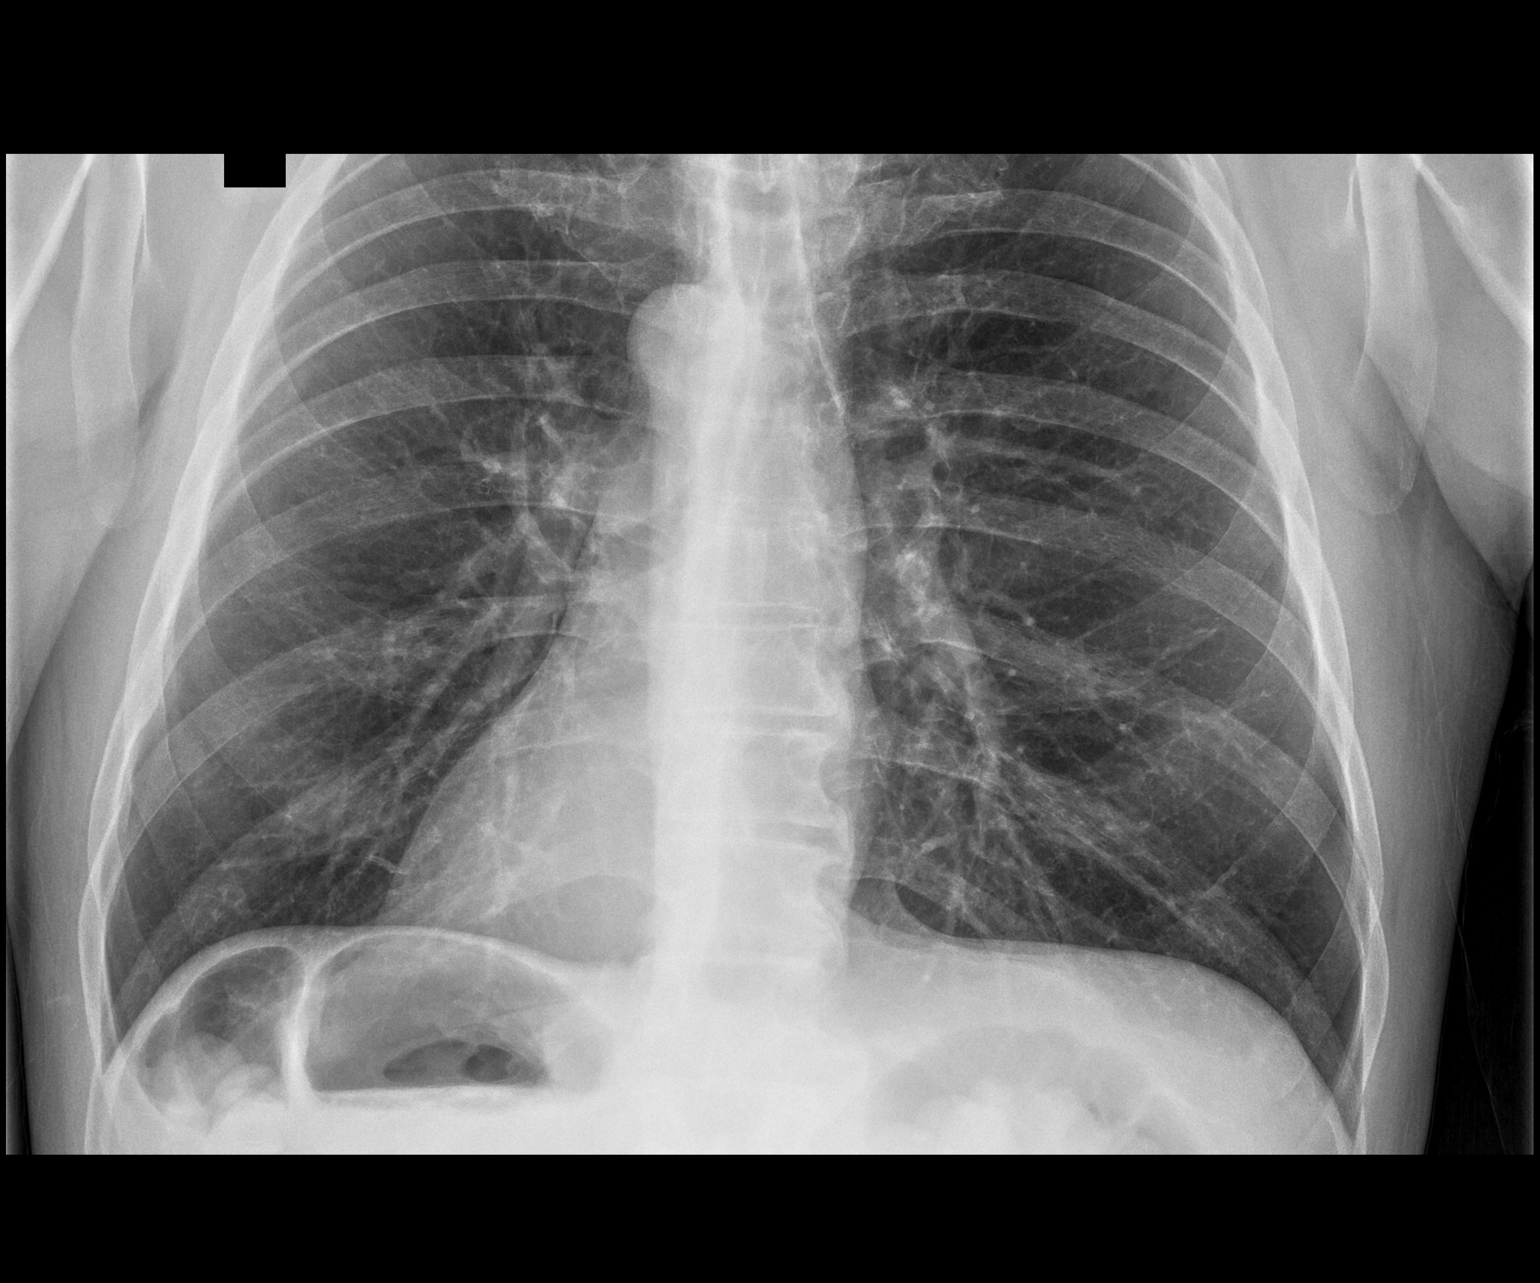
[im 3/3]
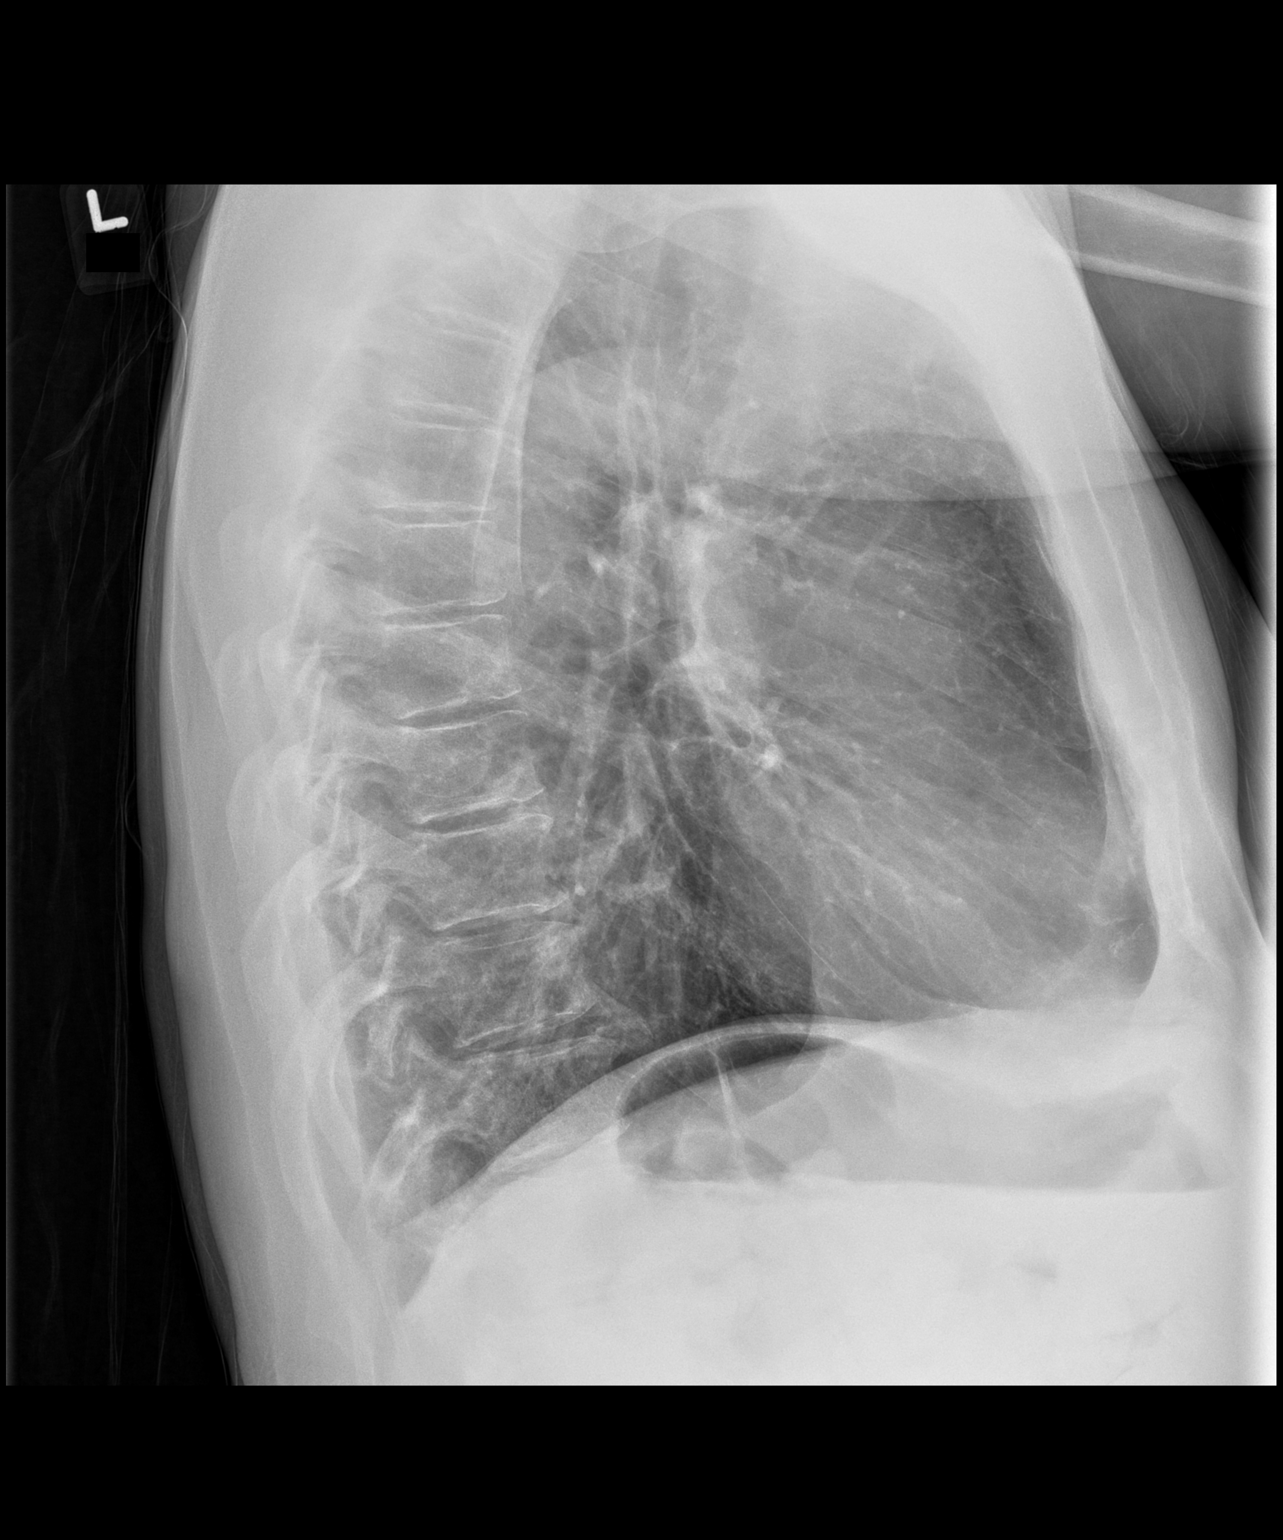

[3 of 3 positions shown; findings below may reference images not displayed]

FINDINGS: Lungs are clear. No consolidation. No effusion. Normal cardiac size 
and pulmonary vascularity. No pneumothorax. No acute osseous abnormality.
IMPRESSION: No acute cardiopulmonary findings.

## 2022-09-18 IMAGING — MR MRI LEFT KNEE WITHOUT CONTRAST
4 of 5 series · 24 of 40 positions shown · IV contrast (gadolinium)
Comparison: Radiograph of 07/14/2022 and dating back to 10/01/2020

________________________________________________________________________________________________ 
MRI LEFT KNEE WITHOUT CONTRAST, 09/18/2022 [DATE]: 
CLINICAL INDICATION: Chronic left medial knee pain. Twisting injury 3 weeks ago. 
History of left knee arthroscopy.
TECHNIQUE: Multiplanar, multiecho position MR images of the knee were performed 
without intravenous gadolinium enhancement. Patient was scanned on a
magnet.

[Series 301: PD fat-sat · axial · left · 3.0mm · 0.36mm/px · z∈[-28,+95]mm · 8 of 36 slices shown (1 of 3)]
[im 1/36]
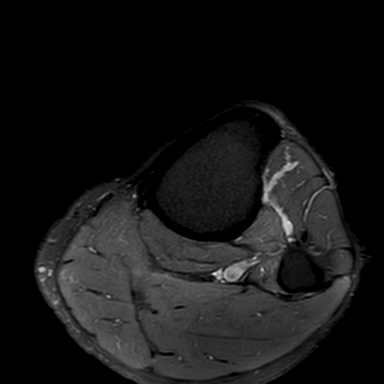
[im 4/36]
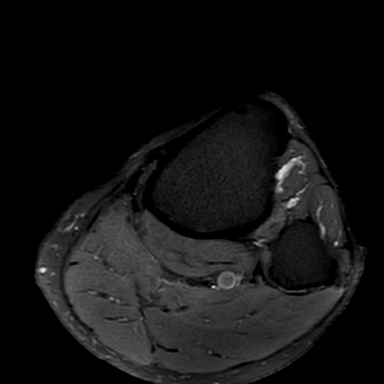
[im 12/36]
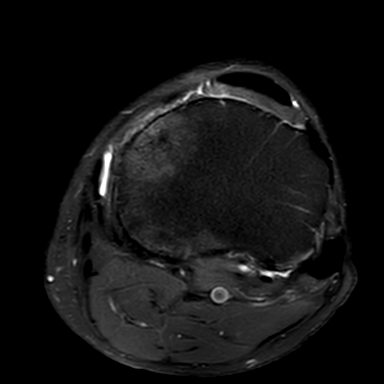
[im 16/36]
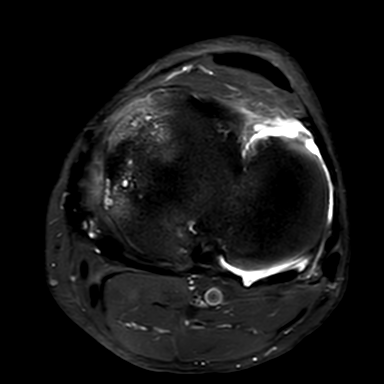
[im 20/36]
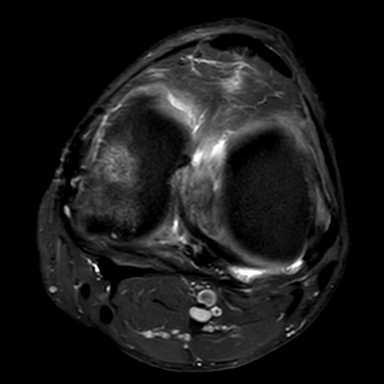
[im 24/36]
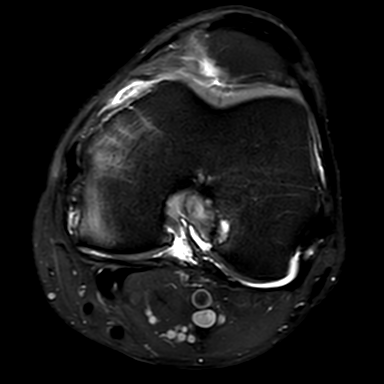
[im 32/36]
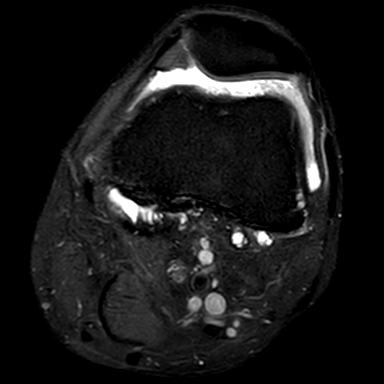
[im 36/36]
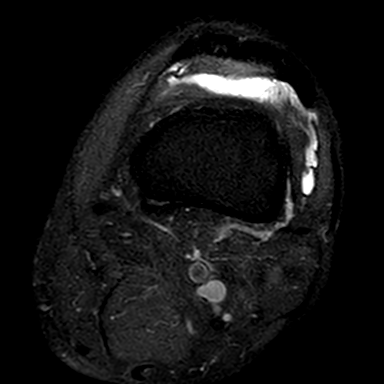

[Series 401: PD fat-sat · sagittal · left · 3.0mm · 0.29mm/px · 9 of 32 slices shown (2 of 3)]
[im 1/32]
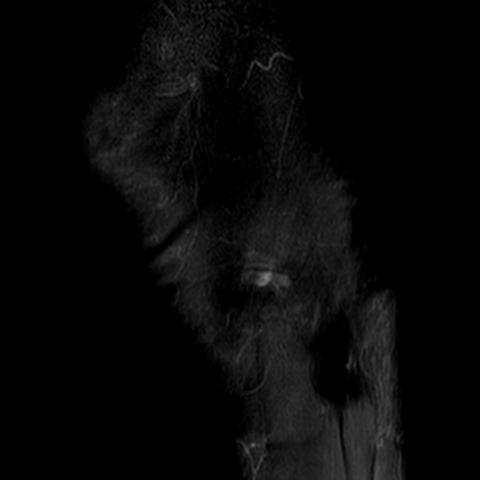
[im 4/32]
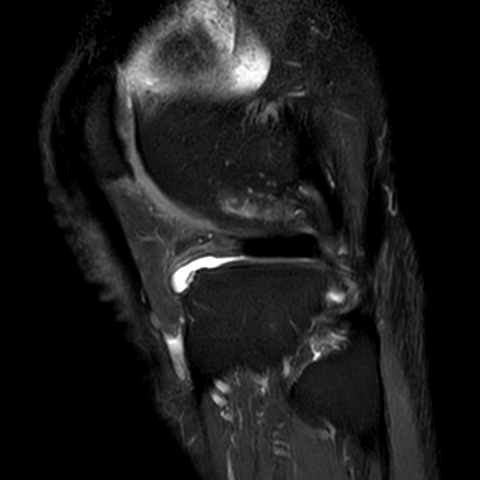
[im 8/32]
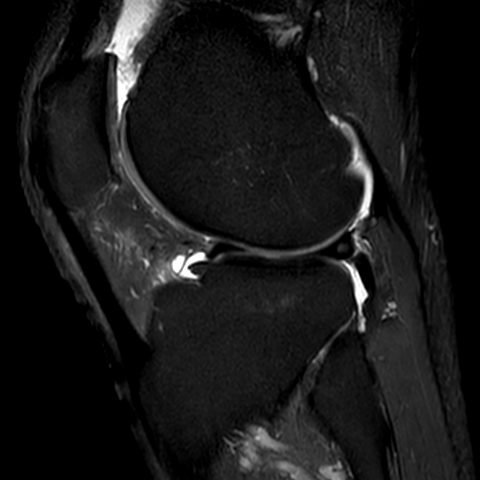
[im 12/32]
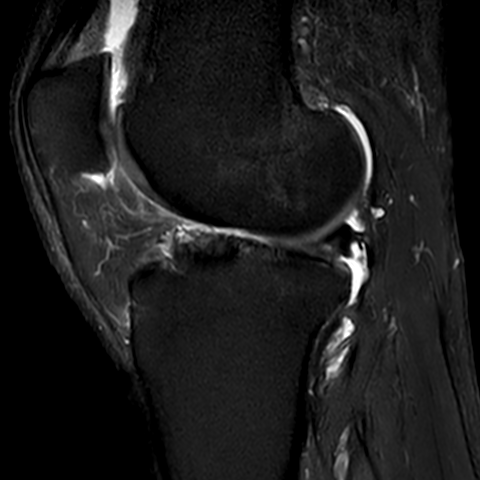
[im 16/32]
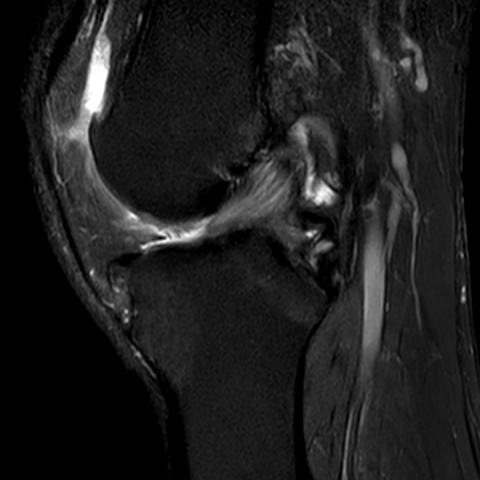
[im 20/32]
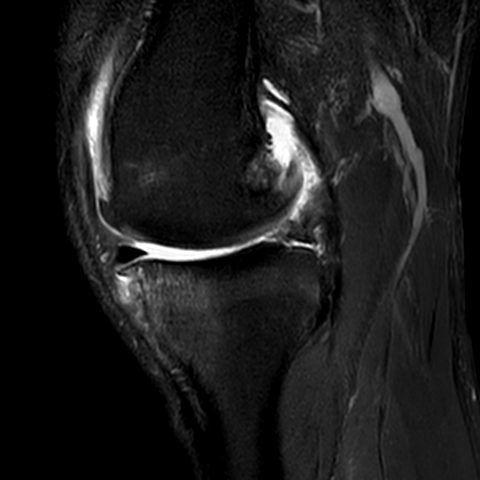
[im 24/32]
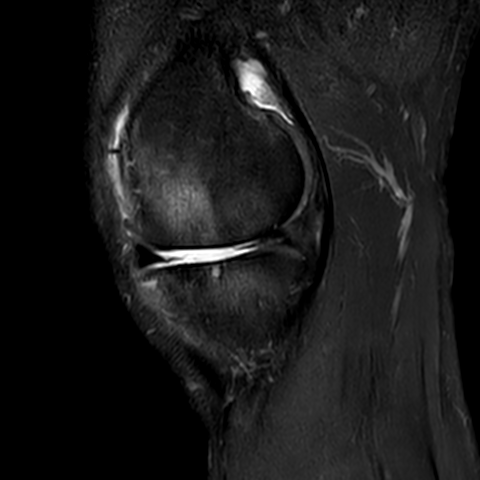
[im 28/32]
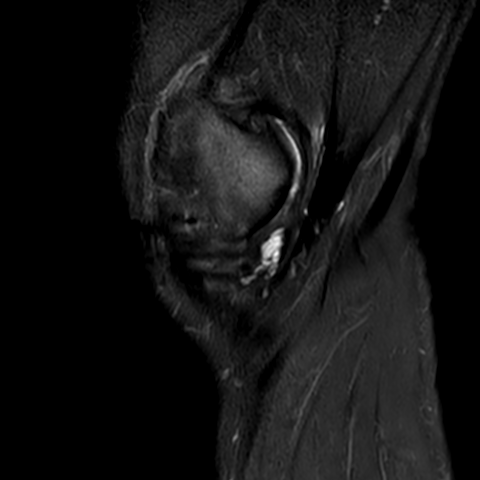
[im 32/32]
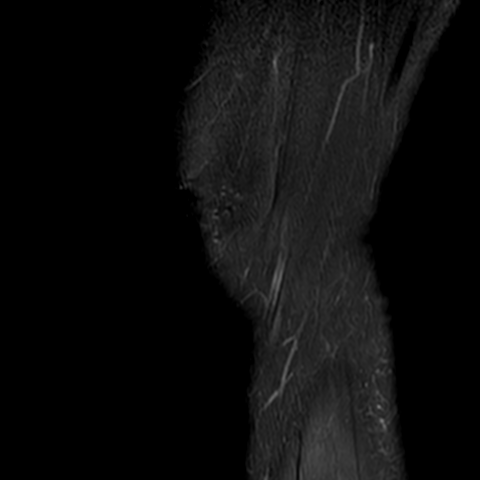

[Series 501: T1 · coronal · left · 3.0mm · 0.31mm/px · 3 of 33 slices shown]
[im 5/33]
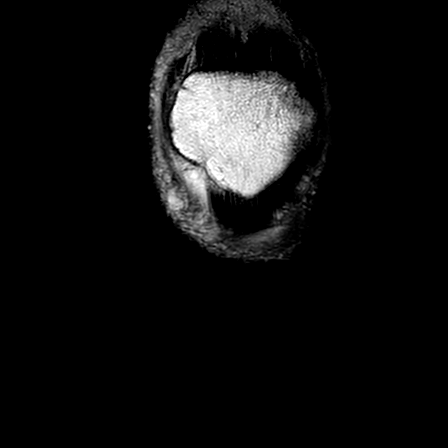
[im 17/33]
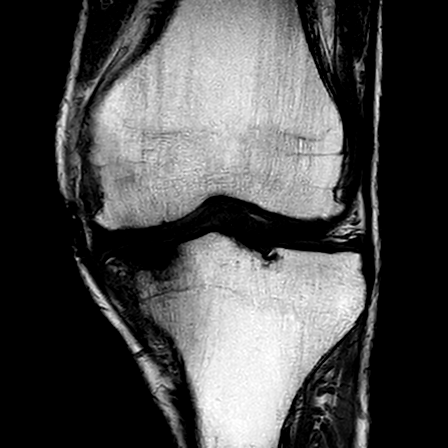
[im 29/33]
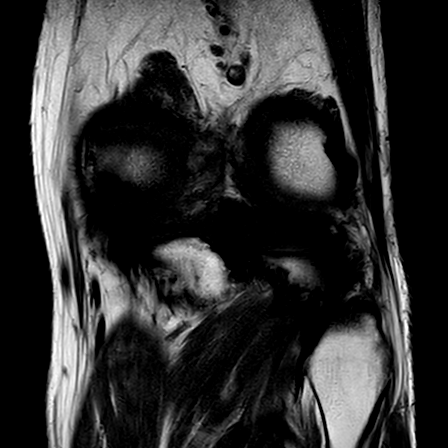

[Series 601: PD fat-sat · coronal · left · 3.0mm · 0.31mm/px · 4 of 33 slices shown (3 of 3)]
[im 1/33]
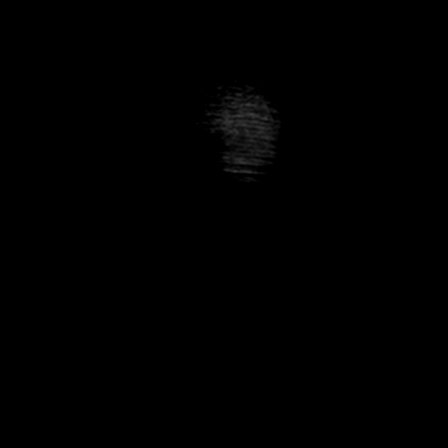
[im 5/33]
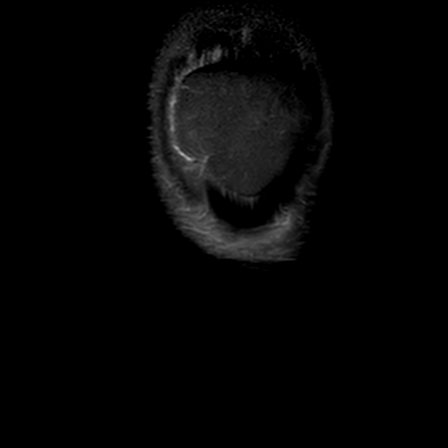
[im 17/33]
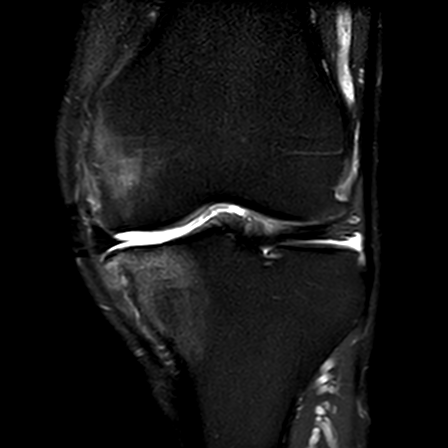
[im 29/33]
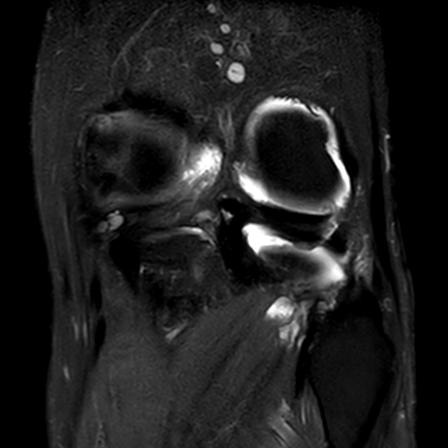

[24 of 40 positions shown; findings below may reference images not displayed]

FINDINGS: MEDIAL COMPARTMENT: Complex tearing with a macerated peripherally extruded 
appearance of the medial meniscus. The medial meniscus is extruded greater than 
3 mm indicative of loss of hoop containment fibers. There is a para meniscal 
cyst measuring approximately 2 cm TR by 0.5 mm AP with adjacent to the posterior 
body and posterior horn. There is predominantly complete articular cartilaginous 
loss, grade 4 with osseous remodeling of both the medial femoral condyle and 
medial tibial plateau with subchondral edema and mild cystic change. 
LATERAL COMPARTMENT: The lateral meniscus is intact without tear or extrusion. 
Moderate articular cartilaginous loss greatest medially, grade 3. 
PATELLOFEMORAL COMPARTMENT: The patella is centrally located. Moderate articular 
cartilaginous loss, grade 3 with greatest involvement of the medial patellar 
facet. 
TIBIOFIBULAR COMPARTMENT: Negative. 
LIGAMENTS: There is loculated fluid associated with the anterior cruciate 
ligament compatible with ganglion associated ACL. There is some intraosseous 
extension within the lateral femoral condyle. The posterior cruciate ligament is 
intact. The medial collateral ligament and lateral collateral ligaments are 
preserved. 
EXTENSOR MECHANISM: The quadriceps and patellar tendon are preserved. The medial 
and lateral retinacula are intact. 
POSTEROMEDIAL CORNER: The semimembranosus and pes anserine tendons are 
preserved. The posterior oblique ligament and posterior medial joint capsule are 
intact. 
POSTEROLATERAL CORNER: Small amount of fluid extends about the posterolateral 
corner about the popliteal tendon. The popliteal tendon and popliteofibular 
ligament are intact. The biceps femoris is negative. 
ADDITIONAL FINDINGS: There is metallic susceptibility artifact about the medial 
aspect of the knee correlate with patients history of prior surgery. No 
fracture. Scattered marginal osteophytes. Small knee joint effusion with 
synovitis. No popliteal cyst. The musculature is normal without mass, signal 
abnormality or atrophy. Neurovascular bundles are negative. Subcutaneous tissues 
are negative.
IMPRESSION: 1.  Tricompartmental degenerative changes with greatest involvement of the 
medial compartment. 
2.  Complex tearing with peripheral extrusion of the medial meniscus. 
3.  Ganglion associated ACL. 
4.  Small knee joint effusion with synovitis.

## 2022-10-03 IMAGING — MR MRI RIGHT ANKLE WITHOUT CONTRAST
3 of 6 series · 8 of 40 positions shown · IV contrast (gadolinium)
Comparison: None.

________________________________________________________________________________________________ 
MRI RIGHT ANKLE WITHOUT CONTRAST, 10/03/2022 [DATE]: 
CLINICAL INDICATION: Pain in right ankle and joints of right foot.
TECHNIQUE: Multiplanar, multiecho position MR images of the ankle were performed 
without intravenous gadolinium enhancement. Patient was scanned on a
magnet.

[Series 201: survey_right · axial · 10.0mm · 0.65mm/px · z∈[-20,+125]mm · 3 of 9 slices shown]
[im 1/9]
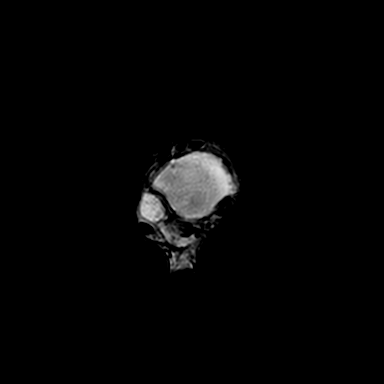
[im 5/9]
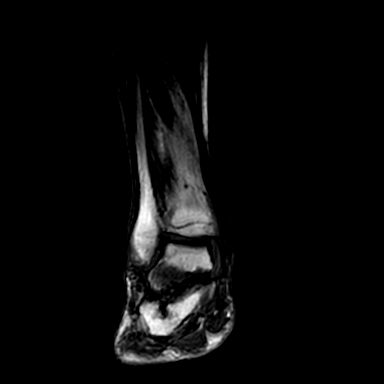
[im 9/9]
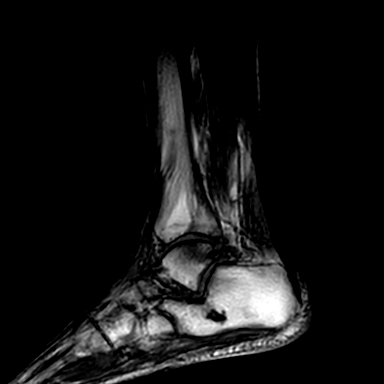

[Series 301: (person_name)_(person_name)_(person_name) · axial · 3.0mm · 0.16mm/px · z∈[-108,+2]mm · 3 of 42 slices shown]
[im 5/42]
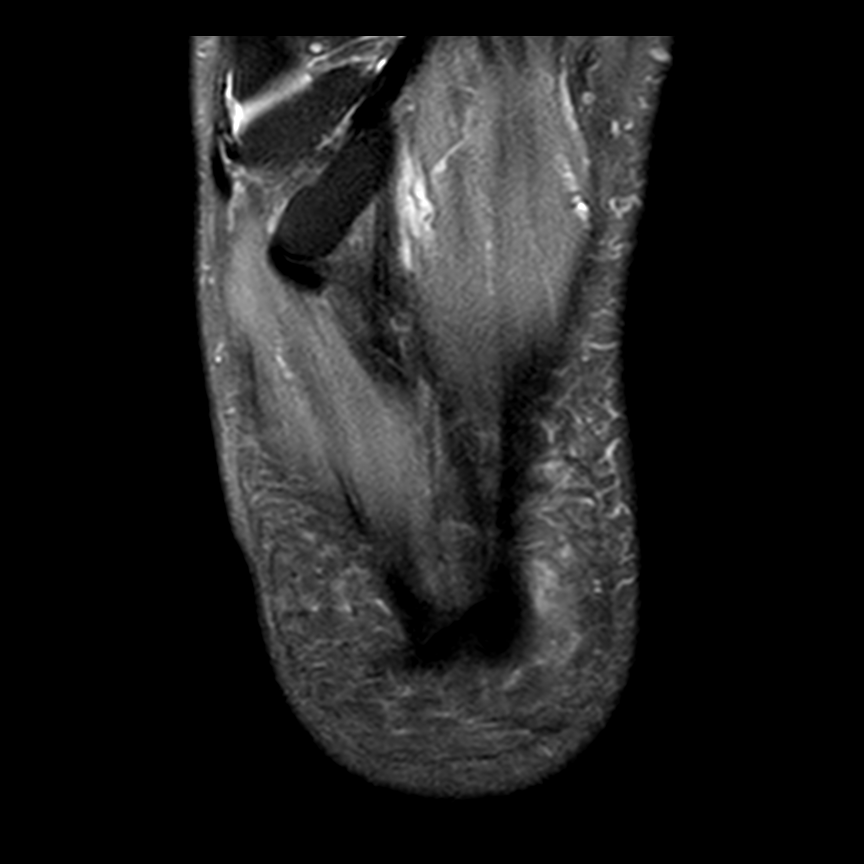
[im 23/42]
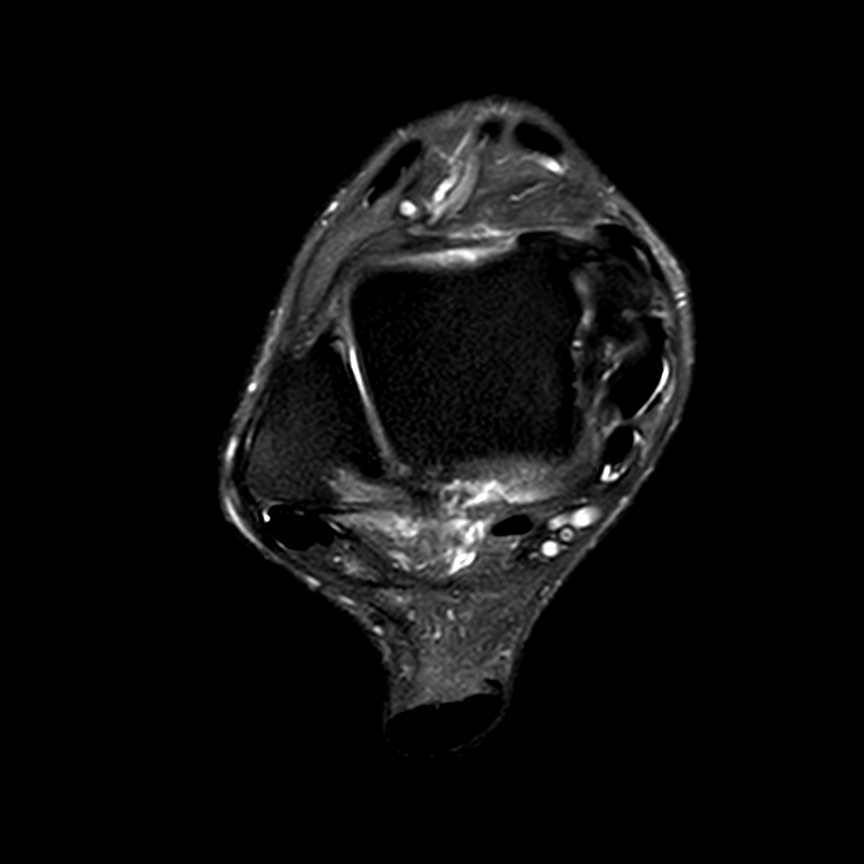
[im 37/42]
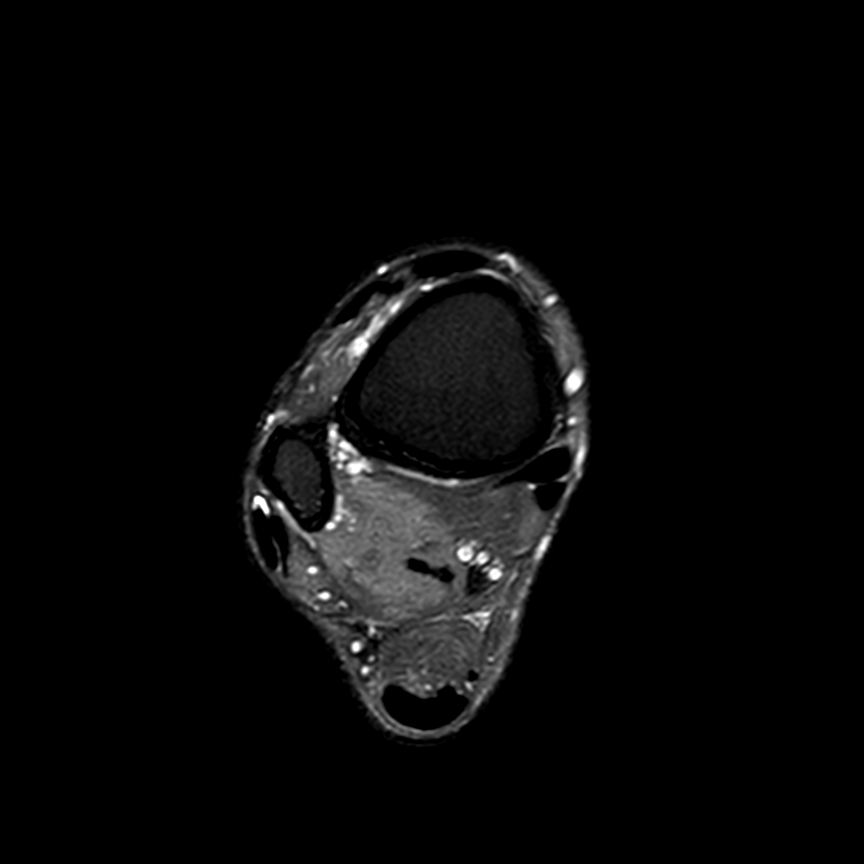

[Series 401: t1_sag · sagittal · 2.5mm · 0.16mm/px · 2 of 27 slices shown]
[im 6/27]
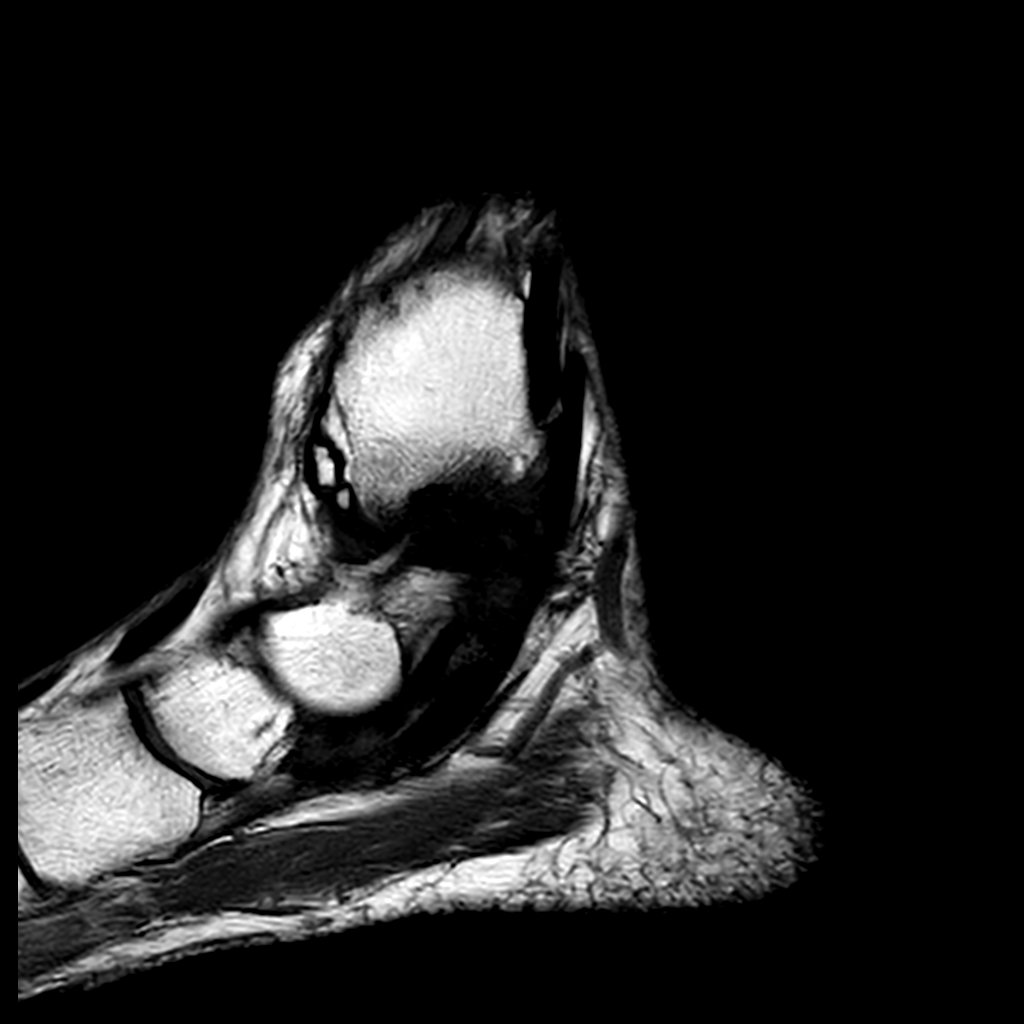
[im 16/27]
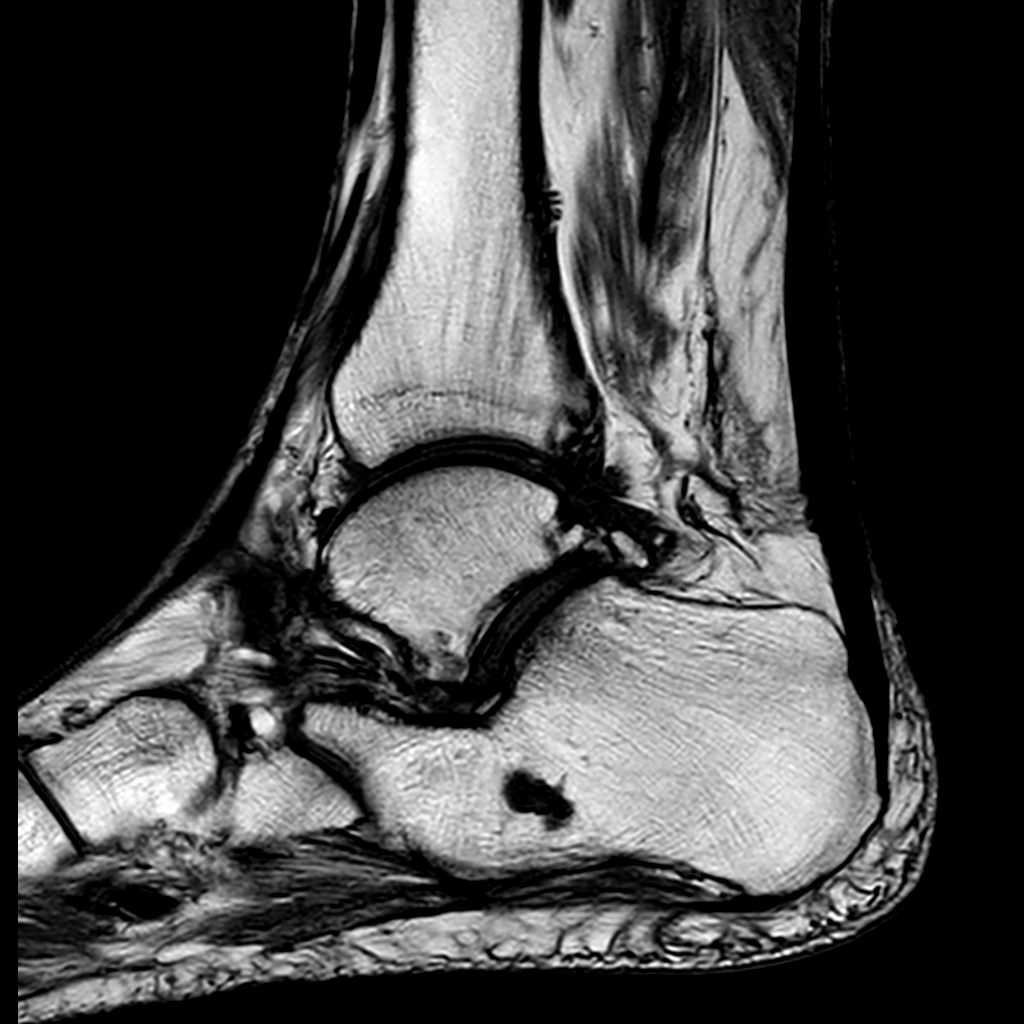

[8 of 40 positions shown; findings below may reference images not displayed]

FINDINGS: TENDONS: Small amount of fluid in the posterior tibial tendon (PTT) and peroneal 
sheaths, consistent with tenosynovitis. Small amount of fluid in the flexor 
hallucis longus and flexor digitorum longus tendon sheaths, which may 
communicate normally with the ankle joint.  The flexor, extensor and peroneal 
tendons are intact. The Achilles tendon is preserved. 
LIGAMENTS:  
LATERAL LIGAMENTS: The anterior talofibular ligament is intact. The 
calcaneofibular ligament and posterior talofibular ligament are preserved. 
SYNDESMOTIC LIGAMENTS: The anterior and posterior tibiofibular and interosseous 
ligaments are preserved. 
DELTOID LIGAMENTOUS COMPLEX: The deep and superficial components of the deltoid 
ligamentous complex are intact. 
SINUS TARSI LIGAMENTS: The cervical and interosseous ligaments are preserved. 
The inferior extensor retinaculum appears intact.  
KILERA LIGAMENTOUS COMPLEX: Plantar calcaneonavicular ligament preserved. 
BONES AND JOINTS: No fracture, contusion or stress response. Mild degenerative 
change of the ankle joint with partial thickness chondromalacia and tiny 
subcortical cysts. No focal osteochondral lesion. Subtalar joint degenerative 
change with mild chondromalacia, small ossified bodies in the posterior subtalar 
recess, subcortical cystic change of the inferior talar body and 0.5 cm adjacent 
ossified body in the sinus tarsi. Superior calcaneal body prominent vascular 
remnants. 0.7 cm lateral calcaneocuboid synovial cyst. 
ADDITIONAL FINDINGS: Musculature is symmetric without mass, signal abnormality 
or atrophy. Sinus tarsi fat is preserved. Plantar fascia is intact. Tarsal 
tunnel is preserved. Minimal soft tissue swelling in [place] fat pad. Subcutaneous 
tissues are negative.
IMPRESSION: 1.  Mild ankle and subtalar joint degenerative change. 
2.  Minimal PTT and peroneal tenosynovitis. 
3.  Minimal soft tissue swelling in [place] fat pad.

## 2023-04-19 IMAGING — DX CHEST PA AND LATERAL
1 series · 2 of 2 positions shown · non-contrast
Comparison: June 2022

________________________________________________________________________________________________ 
CLINICAL INDICATION: Preoperative evaluation prior to knee surgery..

[Series 1: PA · 0.14mm/px · 2 of 2 slices shown]
[im 1/2]
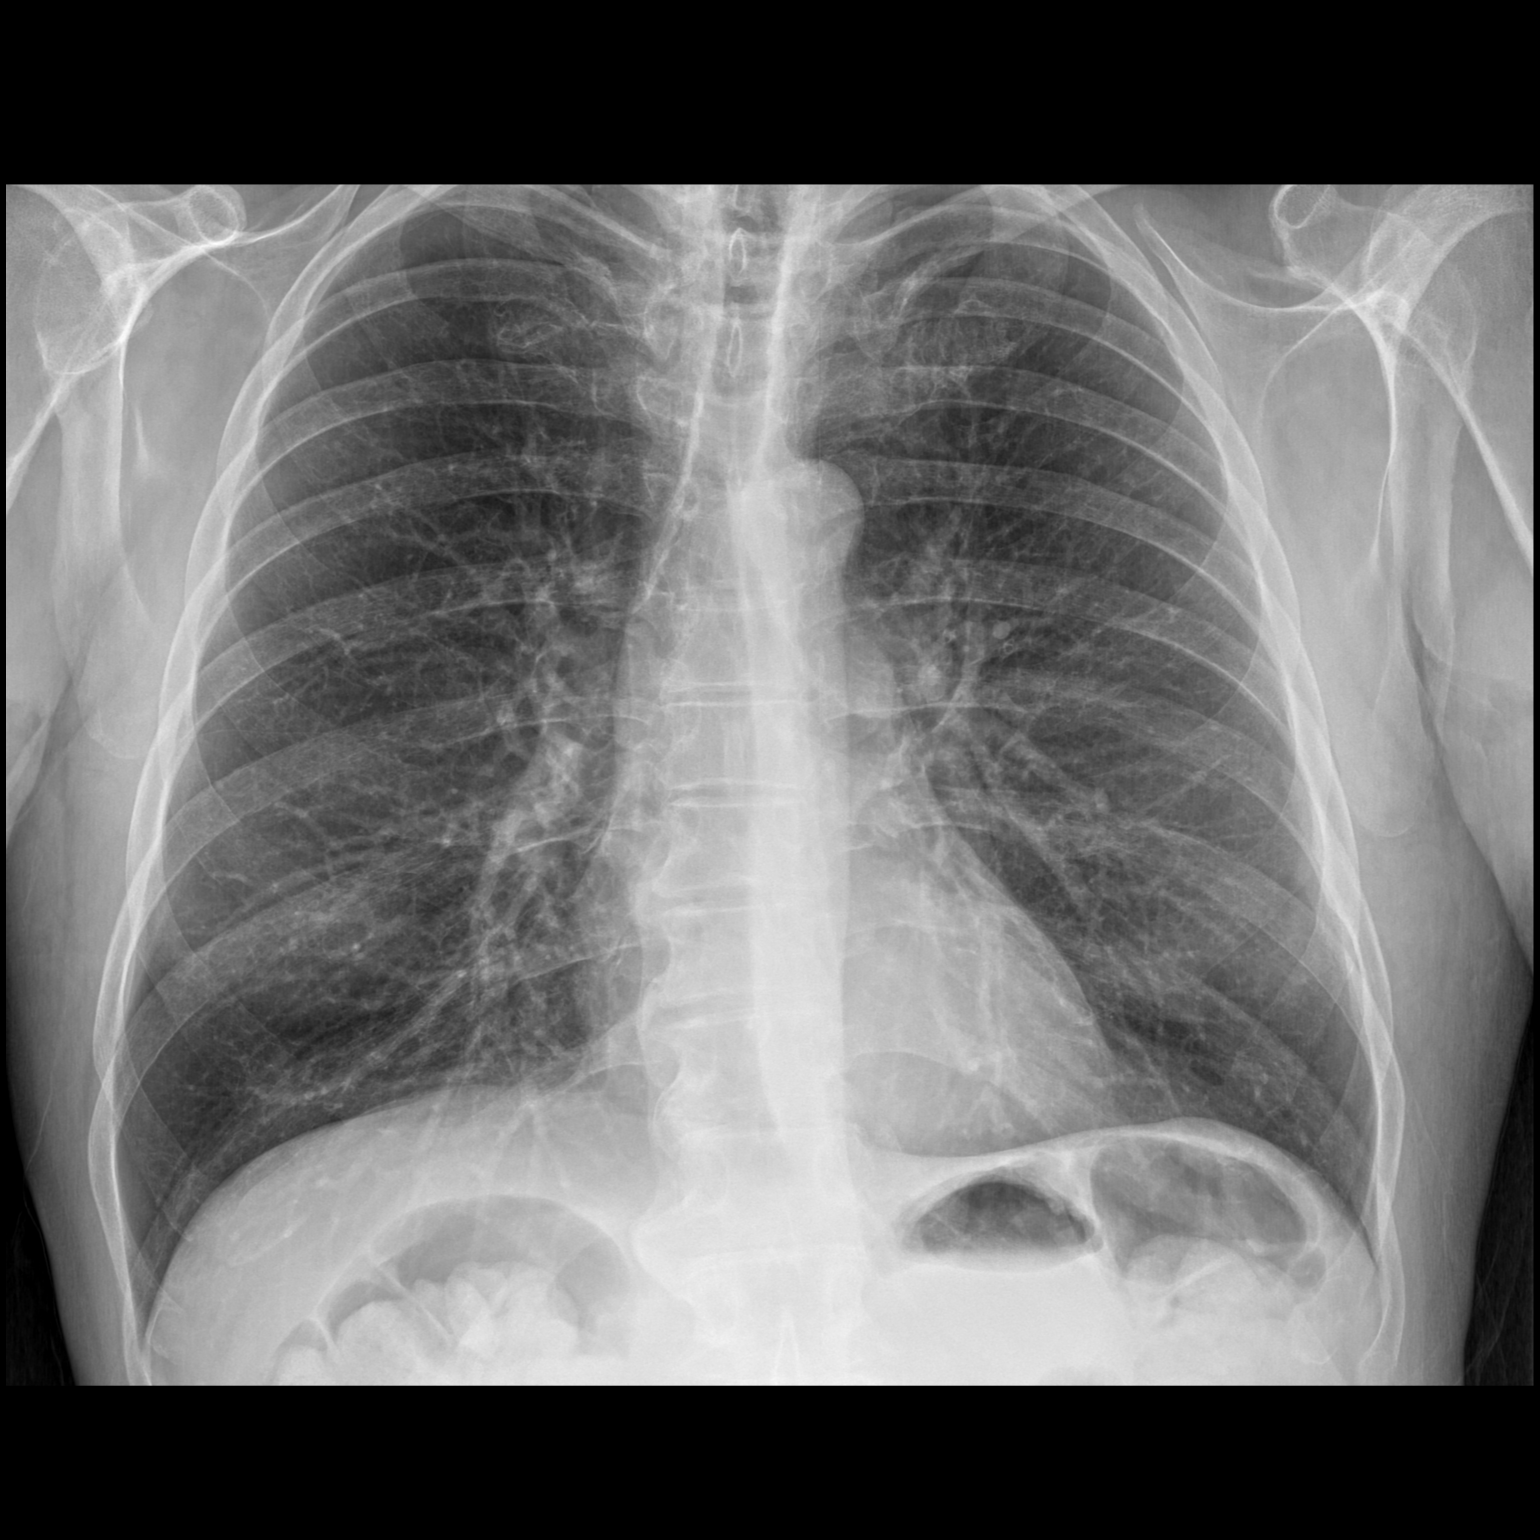
[im 2/2]
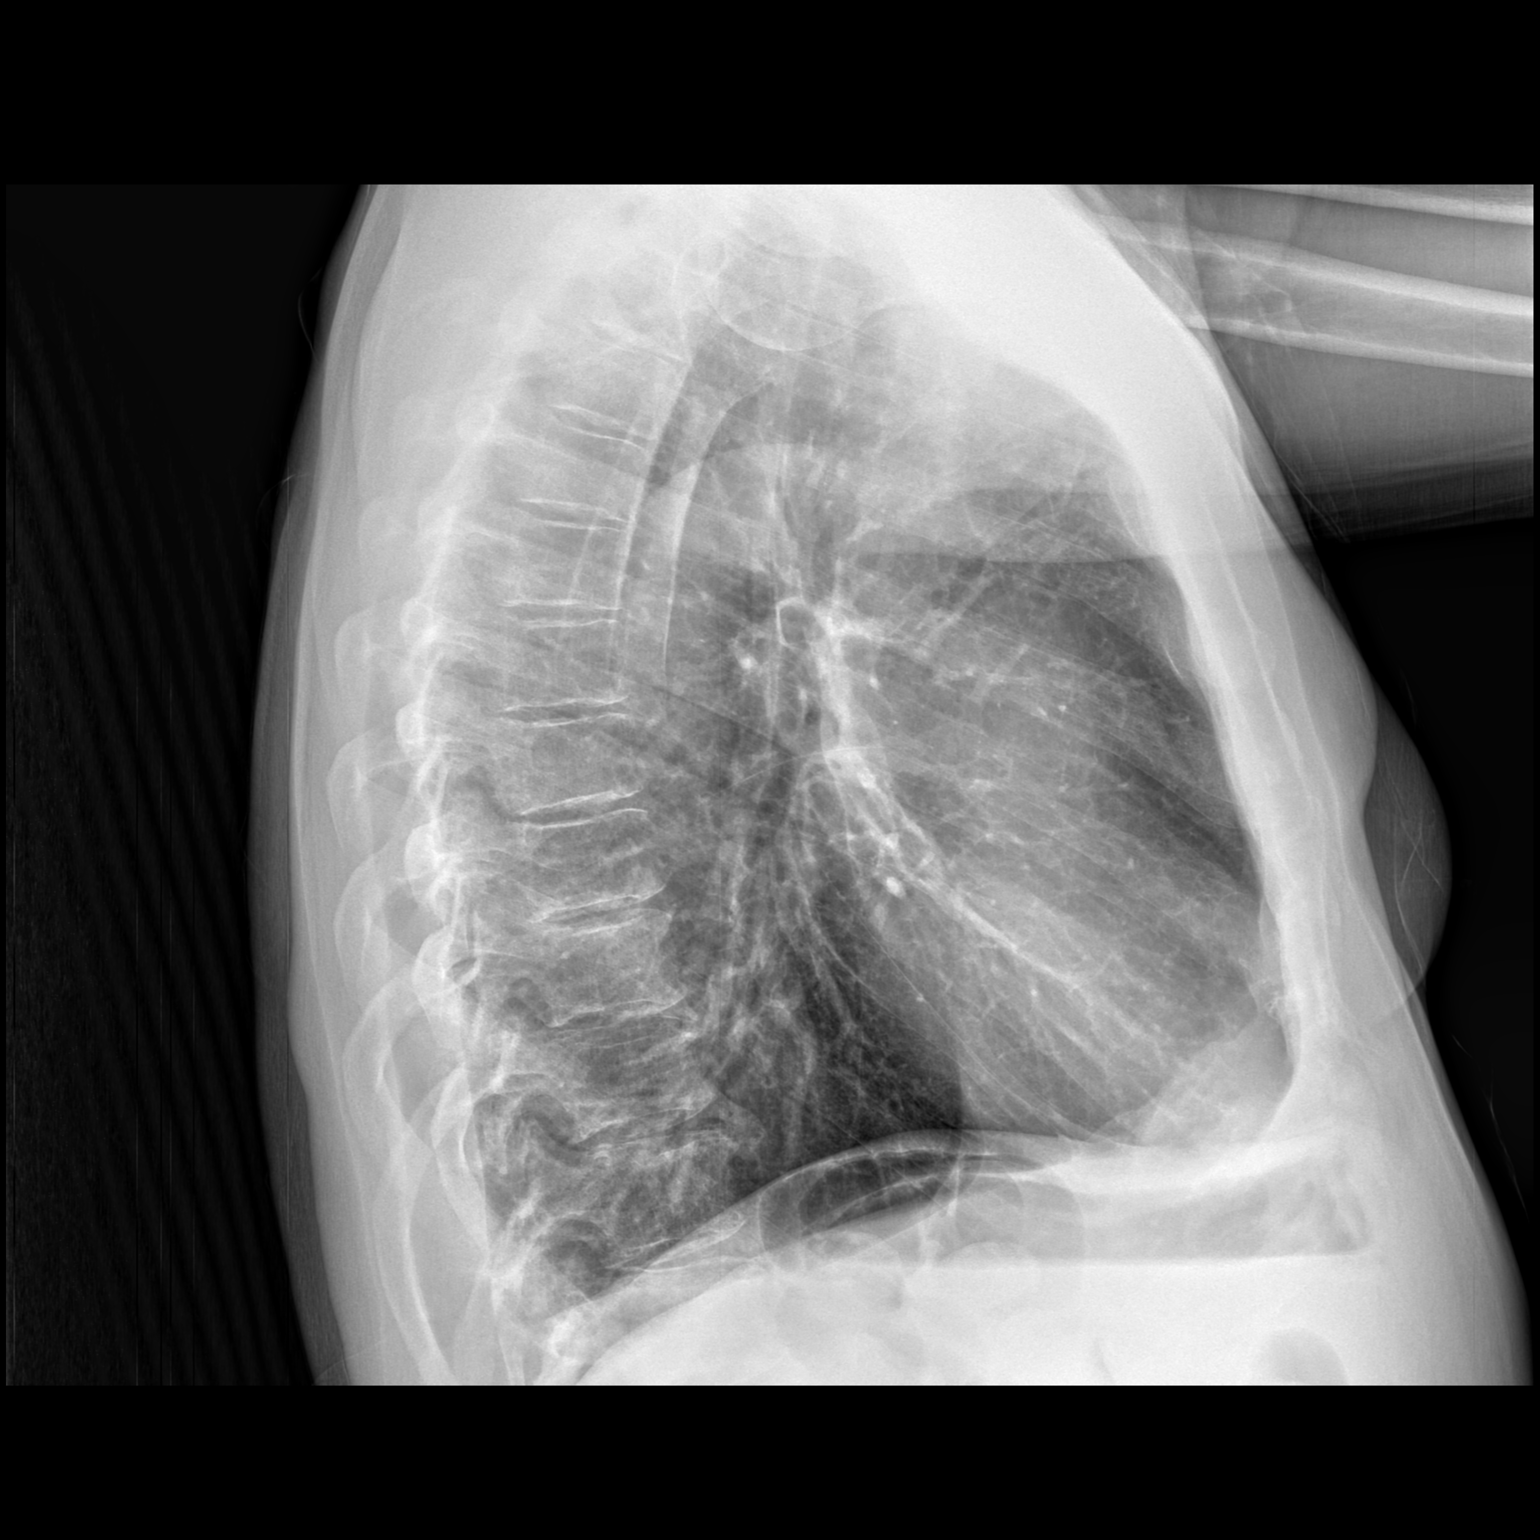

[2 of 2 positions shown; findings below may reference images not displayed]

FINDINGS: Lungs are clear. No consolidation. No effusion. Normal cardiac size 
and pulmonary vascularity. No pneumothorax. No acute osseous abnormality.
IMPRESSION: No acute cardiopulmonary findings.
